# Patient Record
Sex: Male | Born: 1955 | Race: White | Hispanic: No | Marital: Married | Smoking: Current every day smoker
Health system: Southern US, Community
[De-identification: ages and names within clinical notes are randomized; demographics above are authoritative.]

## PROBLEM LIST (undated history)

## (undated) DIAGNOSIS — G35 Multiple sclerosis: Secondary | ICD-10-CM

## (undated) DIAGNOSIS — J449 Chronic obstructive pulmonary disease, unspecified: Secondary | ICD-10-CM

## (undated) DIAGNOSIS — I10 Essential (primary) hypertension: Secondary | ICD-10-CM

## (undated) DIAGNOSIS — H539 Unspecified visual disturbance: Secondary | ICD-10-CM

## (undated) DIAGNOSIS — G629 Polyneuropathy, unspecified: Secondary | ICD-10-CM

## (undated) DIAGNOSIS — C801 Malignant (primary) neoplasm, unspecified: Secondary | ICD-10-CM

## (undated) HISTORY — DX: Multiple sclerosis: G35

## (undated) HISTORY — PX: TONSILLECTOMY AND ADENOIDECTOMY: SHX28

## (undated) HISTORY — DX: Polyneuropathy, unspecified: G62.9

## (undated) HISTORY — PX: TONSILLECTOMY: SUR1361

## (undated) HISTORY — PX: WISDOM TOOTH EXTRACTION: SHX21

## (undated) HISTORY — DX: Unspecified visual disturbance: H53.9

---

## 2015-02-13 ENCOUNTER — Ambulatory Visit (INDEPENDENT_AMBULATORY_CARE_PROVIDER_SITE_OTHER): Payer: PRIVATE HEALTH INSURANCE | Admitting: Neurology

## 2015-02-13 ENCOUNTER — Encounter: Payer: Self-pay | Admitting: Neurology

## 2015-02-13 VITALS — BP 136/80 | HR 68 | Resp 12 | Ht 67.0 in | Wt 135.4 lb

## 2015-02-13 DIAGNOSIS — R350 Frequency of micturition: Secondary | ICD-10-CM | POA: Diagnosis not present

## 2015-02-13 DIAGNOSIS — M545 Low back pain, unspecified: Secondary | ICD-10-CM | POA: Insufficient documentation

## 2015-02-13 DIAGNOSIS — G35 Multiple sclerosis: Secondary | ICD-10-CM

## 2015-02-13 DIAGNOSIS — R5383 Other fatigue: Secondary | ICD-10-CM | POA: Insufficient documentation

## 2015-02-13 DIAGNOSIS — G2581 Restless legs syndrome: Secondary | ICD-10-CM | POA: Insufficient documentation

## 2015-02-13 DIAGNOSIS — R208 Other disturbances of skin sensation: Secondary | ICD-10-CM | POA: Diagnosis not present

## 2015-02-13 MED ORDER — GABAPENTIN 600 MG PO TABS: 600.0000 mg | ORAL_TABLET | Freq: Four times a day (QID) | ORAL | Status: DC

## 2015-02-13 MED ORDER — BACLOFEN 10 MG PO TABS: 10.0000 mg | ORAL_TABLET | Freq: Three times a day (TID) | ORAL | Status: DC

## 2015-02-13 MED ORDER — OXYBUTYNIN CHLORIDE 5 MG PO TABS: 5.0000 mg | ORAL_TABLET | Freq: Two times a day (BID) | ORAL | Status: DC

## 2015-02-13 MED ORDER — HYDROCODONE-ACETAMINOPHEN 5-325 MG PO TABS: 1.0000 | ORAL_TABLET | Freq: Three times a day (TID) | ORAL | Status: DC | PRN

## 2015-02-13 MED ORDER — ROPINIROLE HCL 1 MG PO TABS: 1.0000 mg | ORAL_TABLET | Freq: Two times a day (BID) | ORAL | Status: DC

## 2015-02-13 MED ORDER — GLATIRAMER ACETATE 20 MG/ML ~~LOC~~ SOSY: 20.0000 mg | PREFILLED_SYRINGE | Freq: Every day | SUBCUTANEOUS | Status: AC

## 2015-02-13 NOTE — Progress Notes (Signed)
[facility]  PATIENT: John Sullivan DOB: 1956/11/01  REFERRING DOCTOR OR PCP:  None SOURCE: patient and wife  _________________________________   HISTORICAL  CHIEF COMPLAINT:  Chief Complaint  Patient presents with  . Multiple Sclerosis    Sts. he tolerates Copaxone well.  Sts. he is having more throbbing, dull pain in right heel.  Sts. everything else is about the same/fim    HISTORY OF PRESENT ILLNESS:  John Sullivan is a 59 yo man who was diagnosed with MS in 2001 after presenting with numbness in the hands and legs and vertigo.   At first,m he saw a chiropractor and then was referred to neurology (Dr. Nicole Kindred) after symptoms persisted.   He had an MRI and LP, both consistent with MS.    He went on Rebif x 18 months but did not tolerate it well. He switched to see me around 2004 and was started on Copaxone.  He also took Low dose Naltrexone for a few days.   He continiue son Copaxone every other day and tolerates it well.   He has had exacerbation 5 years ago with diplopia, treated with IV steroid.    His last MRI was about 2 years ago and was fairly stable.      Gait/strength/sensation:  He denies problem with walking or with strength.   His right foot has numbness.  He feels the numbness, with a dull ache, is stable x many years.      Bladder:   He is doing better since starting oxybutynin.   He now has 1 x nocturia and only occasional problem with urgency.  Vision:  No current diplopia or other visual changes.     He wears glasses.  Fatigue/Sleep:  Many days, he has mild fatigue.    He sleeps well with night time marijuana every night.     He snores but has never had apneic symptoms.    He denies daytime sleepiness.  Mood/Cognition:   He denies any depression or anxiety.    He feels unmotivated but not depressed.   He denies any major cognitive problems.   Occasionally,  he has word finding difficulty.    RLS:   He reports an uncomfortable sensation in his  legs, better if he moves his legs.   Ropinirole and gabapentin has helped a lot.    He tolerates them well.   LBP:   He notes LBP that flares up now and then, worse with activity.   No radiation into legs.   He occ takes hydrocodone with benefit  His sister has MS  REVIEW OF SYSTEMS: Constitutional: No fevers, chills, sweats, or change in appetite.    Mild fatigue Eyes: No visual changes, double vision, eye pain Ear, nose and throat: No hearing loss, ear pain, nasal congestion, sore throat Cardiovascular: No chest pain, palpitations Respiratory: No shortness of breath at rest or with exertion.   No wheezes GastrointestinaI: No nausea, vomiting, diarrhea, abdominal pain, fecal incontinence Genitourinary: No dysuria, urinary retention or frequency.  No nocturia. Musculoskeletal: No neck pain, back pain Integumentary: No rash, pruritus, skin lesions Neurological: as above Psychiatric: No depression at this time.  No anxiety Endocrine: No palpitations, diaphoresis, change in appetite, change in weigh or increased thirst Hematologic/Lymphatic: No anemia, purpura, petechiae. Allergic/Immunologic: No itchy/runny eyes, nasal congestion, recent allergic reactions, rashes  ALLERGIES: No Known Allergies  HOME MEDICATIONS:  Current outpatient prescriptions:  .  amoxicillin (AMOXIL) 500 MG capsule, Take 500 mg by mouth 4 (  four) times daily., Disp: , Rfl:  .  baclofen (LIORESAL) 10 MG tablet, Take 10 mg by mouth 3 (three) times daily., Disp: , Rfl:  .  cholecalciferol (VITAMIN D) 1000 UNITS tablet, Take 1,000 Units by mouth daily., Disp: , Rfl:  .  gabapentin (NEURONTIN) 600 MG tablet, , Disp: , Rfl: 4 .  glatiramer (COPAXONE) 20 MG/ML SOSY injection, Inject 20 mg into the skin daily., Disp: , Rfl:  .  HYDROcodone-acetaminophen (NORCO/VICODIN) 5-325 MG per tablet, Take 1 tablet by mouth 3 (three) times daily as needed for moderate pain., Disp: , Rfl:  .  Multiple Vitamin (MULTIVITAMIN)  capsule, Take 1 capsule by mouth daily., Disp: , Rfl:  .  oxybutynin (DITROPAN) 5 MG tablet, Take 5 mg by mouth 2 (two) times daily., Disp: , Rfl: 3 .  rOPINIRole (REQUIP) 1 MG tablet, Take 1 mg by mouth 2 (two) times daily., Disp: , Rfl:   PAST MEDICAL HISTORY: Past Medical History  Diagnosis Date  . Multiple sclerosis   . Neuropathy   . Vision abnormalities     PAST SURGICAL HISTORY: Past Surgical History  Procedure Laterality Date  . Tonsillectomy and adenoidectomy    . Wisdom tooth extraction      FAMILY HISTORY: Family History  Problem Relation Age of Onset  . AAA (abdominal aortic aneurysm) Mother   . Diabetes Father     SOCIAL HISTORY:  History   Social History  . Marital Status: Significant Other    Spouse Name: N/A  . Number of Children: N/A  . Years of Education: N/A   Occupational History  . Not on file.   Social History Main Topics  . Smoking status: Current Every Day Smoker -- 0.50 packs/day    Types: Cigarettes  . Smokeless tobacco: Not on file  . Alcohol Use: 0.0 oz/week    0 Standard drinks or equivalent per week     Comment: rare  . Drug Use: No  . Sexual Activity: Not on file   Other Topics Concern  . Not on file   Social History Narrative  . No narrative on file     PHYSICAL EXAM  Filed Vitals:   02/13/15 1440  BP: 136/80  Pulse: 68  Resp: 12  Height: 5\' 7"  (1.702 m)  Weight: 135 lb 6.4 oz (61.417 kg)    Body mass index is 21.2 kg/(m^2).   General: The patient is well-developed and well-nourished and in no acute distress  Eyes:  Funduscopic exam shows normal optic discs and retinal vessels.  Neck: The neck is supple, no carotid bruits are noted.  The neck is nontender.  Cardiovascular: The heart has a regular rate and rhythm with a normal S1 and S2. There were no murmurs, gallops or rubs. Lungs are clear to auscultation.  Skin: Extremities are without significant edema.  Musculoskeletal:  Back is  nontender  Neurologic Exam  Mental status: The patient is alert and oriented x 3 at the time of the examination. The patient has apparent normal recent and remote memory, with an apparently normal attention span and concentration ability.   Speech is normal.  Cranial nerves: Extraocular movements are full. Pupils are equal, round, and reactive to light and accomodation.  Visual fields are full.  Facial symmetry is present. There is good facial sensation to soft touch bilaterally.Facial strength is normal.  Trapezius and sternocleidomastoid strength is normal. No dysarthria is noted.  The tongue is midline, and the patient has symmetric elevation of the soft  palate. No obvious hearing deficits are noted.  Motor:  Muscle bulk is normal.   Tone is normal. Strength is  5 / 5 in all 4 extremities.   Sensory: Sensory testing is intact to pinprick, soft touch and vibration sensation in all 4 extremities.  Coordination: Cerebellar testing reveals good finger-nose-finger and heel-to-shin bilaterally.  Gait and station: Station is normal.   Gait is normal. Tandem gait is wide. Romberg is negative.   Reflexes: Deep tendon reflexes are symmetric and normal bilaterally.   Plantar responses are flexor.    DIAGNOSTIC DATA (LABS, IMAGING, TESTING) - I reviewed patient records, labs, notes, testing and imaging myself where available.    ASSESSMENT AND PLAN  Multiple sclerosis  Bilateral low back pain without sciatica  Urinary frequency  Other fatigue  Dysesthesia  Restless leg   In summary, Mr. Ganaway is a 59 year old man with relapsing remitting multiple sclerosis. He has not had any exacerbation over the past 4 or 5 years and has had only one serious exacerbation while on Copaxone. MRIs have been mostly stable. He will continue on Copaxone, though we did briefly discussed the oral options. His back pain continues to bother him at times when more severe he will take a hydrocodone and do  better. I refilled this for him. Gabapentin and ropinirole helping the dysesthesias and restless leg syndrome and that has allowed him to sleep better. This has also helped his fatigue. Bladder function does well on oxybutynin. I will refill his medications for him.  He will return to see [place] in 6 months or call sooner if he has new or worsening neurologic symptoms.   Richard A. Felecia Shelling, MD, PhD 5/85/9292, 4:46 PM Certified in Neurology, Clinical Neurophysiology, Sleep Medicine, Pain Medicine and Neuroimaging  [facility] [address] [place], [place] [address] [phone]

## 2015-08-19 ENCOUNTER — Encounter: Payer: Self-pay | Admitting: Neurology

## 2015-08-19 ENCOUNTER — Ambulatory Visit (INDEPENDENT_AMBULATORY_CARE_PROVIDER_SITE_OTHER): Payer: PRIVATE HEALTH INSURANCE | Admitting: Neurology

## 2015-08-19 VITALS — BP 130/76 | HR 92 | Resp 14 | Ht 67.0 in | Wt 135.0 lb

## 2015-08-19 DIAGNOSIS — M25512 Pain in left shoulder: Secondary | ICD-10-CM | POA: Insufficient documentation

## 2015-08-19 DIAGNOSIS — G2581 Restless legs syndrome: Secondary | ICD-10-CM

## 2015-08-19 DIAGNOSIS — G35 Multiple sclerosis: Secondary | ICD-10-CM | POA: Diagnosis not present

## 2015-08-19 DIAGNOSIS — R350 Frequency of micturition: Secondary | ICD-10-CM | POA: Diagnosis not present

## 2015-08-19 DIAGNOSIS — R5383 Other fatigue: Secondary | ICD-10-CM

## 2015-08-19 DIAGNOSIS — R208 Other disturbances of skin sensation: Secondary | ICD-10-CM | POA: Diagnosis not present

## 2015-08-19 MED ORDER — HYDROCODONE-ACETAMINOPHEN 5-325 MG PO TABS: 1.0000 | ORAL_TABLET | Freq: Three times a day (TID) | ORAL | Status: DC | PRN

## 2015-08-19 MED ORDER — GABAPENTIN 800 MG PO TABS: 800.0000 mg | ORAL_TABLET | Freq: Three times a day (TID) | ORAL | Status: DC

## 2015-08-19 NOTE — Progress Notes (Signed)
[facility]  PATIENT: John Sullivan DOB: 03-15-1956  REFERRING DOCTOR OR PCP:  None SOURCE: patient and wife  _________________________________   HISTORICAL  CHIEF COMPLAINT:  Chief Complaint  Patient presents with  . Multiple Sclerosis    Sts. he continues to tolerate Copaxone well.  Sts. lbp radiating down both legs (right worse than left) has been worse over the last 8 mos.  Denies new injury.  Sts. RLS sx. are "pretty good"/fim    HISTORY OF PRESENT ILLNESS:  John Sullivan is a 58 yo man with MS.    He is on Copaxone every other day and tolerates it well.   He has had exacerbation 5 years ago with diplopia, treated with IV steroid.    His last MRI was about 2 years ago and was fairly stable.      Gait/strength/sensation:  He denies problem with walking or with strength.   His right foot has numbness.  He feels the numbness, with a dull ache, is stable x many years.      Bladder:   He is doing better since starting oxybutynin.   He now has 1 x nocturia and only occasional problem with urgency.  Vision:  No current diplopia or other visual changes.     He wears glasses.  Fatigue/Sleep:  Many days, he has mild fatigue.    He sleeps well with night time marijuana every night.     He snores but has never had apneic symptoms.    He denies daytime sleepiness.  Mood/Cognition:   He denies any depression or anxiety.    He feels unmotivated but not depressed.   He denies any major cognitive problems.   Occasionally,  he has word finding difficulty.    Irritability improved on [place] John's Wort  RLS:   He has an uncomfortable sensation in his legs, that is better if he moves his legs.   Ropinirole and gabapentin and MJ has helped a lot.    He tolerates them well.   LBP:   He notes LBP that flares up now and then, worse with activity.   No radiation into legs.   He occ takes hydrocodone with benefit  (seldom more than a few times a week)  Shoulder pain:   He reports  left shoulder and neck pain.   Pain is worse when he externally rotates or scratches back.     MS History:   He was diagnosed with MS in 2001 after presenting with numbness in the hands and legs and vertigo.  He saw Dr. Nicole Kindred after symptoms persisted.   He had an MRI and LP, both consistent with MS.    He went on Rebif x 18 months but did not tolerate it well. He switched to see me around 2004.   MRI's have been stable on Copaxone.  He also took low dose Naltrexone for a few days.    His sister has MS, also  REVIEW OF SYSTEMS: Constitutional: No fevers, chills, sweats, or change in appetite.    Mild fatigue Eyes: No visual changes, double vision, eye pain Ear, nose and throat: No hearing loss, ear pain, nasal congestion, sore throat Cardiovascular: No chest pain, palpitations Respiratory: No shortness of breath at rest or with exertion.   No wheezes GastrointestinaI: No nausea, vomiting, diarrhea, abdominal pain, fecal incontinence Genitourinary: No dysuria, urinary retention or frequency.  No nocturia. Musculoskeletal: No neck pain, back pain Integumentary: No rash, pruritus, skin lesions Neurological: as above Psychiatric:  No depression at this time.  No anxiety Endocrine: No palpitations, diaphoresis, change in appetite, change in weigh or increased thirst Hematologic/Lymphatic: No anemia, purpura, petechiae. Allergic/Immunologic: No itchy/runny eyes, nasal congestion, recent allergic reactions, rashes  ALLERGIES: No Known Allergies  HOME MEDICATIONS:  Current outpatient prescriptions:  .  baclofen (LIORESAL) 10 MG tablet, Take 1 tablet (10 mg total) by mouth 3 (three) times daily., Disp: 90 each, Rfl: 11 .  cholecalciferol (VITAMIN D) 1000 UNITS tablet, Take 1,000 Units by mouth daily., Disp: , Rfl:  .  gabapentin (NEURONTIN) 600 MG tablet, Take 1 tablet (600 mg total) by mouth 4 (four) times daily., Disp: 100 tablet, Rfl: 11 .  glatiramer (COPAXONE) 20 MG/ML SOSY injection,  Inject 1 mL (20 mg total) into the skin daily., Disp: 30 each, Rfl: 11 .  HYDROcodone-acetaminophen (NORCO/VICODIN) 5-325 MG per tablet, Take 1 tablet by mouth 3 (three) times daily as needed for moderate pain., Disp: 90 tablet, Rfl: 0 .  Multiple Vitamin (MULTIVITAMIN) capsule, Take 1 capsule by mouth daily., Disp: , Rfl:  .  oxybutynin (DITROPAN) 5 MG tablet, Take 1 tablet (5 mg total) by mouth 2 (two) times daily., Disp: 60 tablet, Rfl: 11 .  rOPINIRole (REQUIP) 1 MG tablet, Take 1 tablet (1 mg total) by mouth 2 (two) times daily., Disp: 60 tablet, Rfl: 11 .  amoxicillin (AMOXIL) 500 MG capsule, Take 500 mg by mouth 4 (four) times daily., Disp: , Rfl:   PAST MEDICAL HISTORY: Past Medical History  Diagnosis Date  . Multiple sclerosis   . Neuropathy   . Vision abnormalities     PAST SURGICAL HISTORY: Past Surgical History  Procedure Laterality Date  . Tonsillectomy and adenoidectomy    . Wisdom tooth extraction      FAMILY HISTORY: Family History  Problem Relation Age of Onset  . AAA (abdominal aortic aneurysm) Mother   . Diabetes Father     SOCIAL HISTORY:  Social History   Social History  . Marital Status: Significant Other    Spouse Name: N/A  . Number of Children: N/A  . Years of Education: N/A   Occupational History  . Not on file.   Social History Main Topics  . Smoking status: Current Every Day Smoker -- 0.50 packs/day    Types: Cigarettes  . Smokeless tobacco: Not on file  . Alcohol Use: 0.0 oz/week    0 Standard drinks or equivalent per week     Comment: rare  . Drug Use: No  . Sexual Activity: Not on file   Other Topics Concern  . Not on file   Social History Narrative     PHYSICAL EXAM  Filed Vitals:   08/19/15 1409  BP: 130/76  Pulse: 92  Resp: 14  Height: 5\' 7"  (1.702 m)  Weight: 135 lb (61.236 kg)    Body mass index is 21.14 kg/(m^2).   General: The patient is well-developed and well-nourished and in no acute distress  Neck:  The neck is supple, no carotid bruits are noted.  The neck is nontender.  Musculoskeletal:  Back is mildly tender. The left shoulder is tender at the subacromial bursa. Range of motion is slightly reduced in the left shoulder.  Neurologic Exam  Mental status: The patient is alert and oriented x 3 at the time of the examination. The patient has apparent normal recent and remote memory, with an apparently normal attention span and concentration ability.   Speech is normal.  Cranial nerves: Extraocular movements are full.  Pupils are equal, round, and reactive to light and accomodation.  Visual fields are full.  Facial symmetry is present. There is good facial sensation to soft touch bilaterally.Facial strength is normal.  Trapezius and sternocleidomastoid strength is normal. No dysarthria is noted.  The tongue is midline, and the patient has symmetric elevation of the soft palate. No obvious hearing deficits are noted.  Motor:  Muscle bulk is normal.   Tone is normal. Strength is  5 / 5 in all 4 extremities.   Sensory: Sensory testing is intact to  soft touch and vibration sensation in all 4 extremities.  Coordination: Cerebellar testing reveals good finger-nose-finger and heel-to-shin bilaterally.  Gait and station: Station is normal.   Gait is normal. Tandem gait is wide. Romberg is negative.   Reflexes: Deep tendon reflexes are symmetric and normal bilaterally.    DIAGNOSTIC DATA (LABS, IMAGING, TESTING) - I reviewed patient records, labs, notes, testing and imaging myself where available.    ASSESSMENT AND PLAN  Left shoulder pain  Multiple sclerosis  Urinary frequency  Restless leg  Dysesthesia  Other fatigue  1.  Continue Copaxone. 2.   Change gabapentin to 800 mg by mouth 3 times a day and continue when necessary ropinirole for days when restless leg syndrome is worse. 3.   I gave her another prescription for hydrocodone. He uses it sparingly. 4.   He will continue to  be active and exercises as tolerated. 5.   He will return to see [place] in 6\5 months or call sooner if he has new or worsening neurologic symptoms.   David Towson A. Felecia Shelling, MD, PhD 1/76/1607, 3:71 PM Certified in Neurology, Clinical Neurophysiology, Sleep Medicine, Pain Medicine and Neuroimaging  [facility] [address], [place], [place] [address] [phone]

## 2016-01-20 ENCOUNTER — Ambulatory Visit: Payer: PRIVATE HEALTH INSURANCE | Admitting: Neurology

## 2016-03-04 ENCOUNTER — Ambulatory Visit (INDEPENDENT_AMBULATORY_CARE_PROVIDER_SITE_OTHER): Payer: BLUE CROSS/BLUE SHIELD | Admitting: Neurology

## 2016-03-04 ENCOUNTER — Encounter: Payer: Self-pay | Admitting: Neurology

## 2016-03-04 ENCOUNTER — Other Ambulatory Visit: Payer: Self-pay | Admitting: Neurology

## 2016-03-04 VITALS — BP 122/74 | HR 82 | Resp 16 | Ht 67.0 in | Wt 136.5 lb

## 2016-03-04 DIAGNOSIS — G35 Multiple sclerosis: Secondary | ICD-10-CM

## 2016-03-04 DIAGNOSIS — G2581 Restless legs syndrome: Secondary | ICD-10-CM | POA: Diagnosis not present

## 2016-03-04 DIAGNOSIS — R5383 Other fatigue: Secondary | ICD-10-CM

## 2016-03-04 DIAGNOSIS — R350 Frequency of micturition: Secondary | ICD-10-CM

## 2016-03-04 DIAGNOSIS — R208 Other disturbances of skin sensation: Secondary | ICD-10-CM | POA: Diagnosis not present

## 2016-03-04 MED ORDER — LAMOTRIGINE 25 MG PO TABS
ORAL_TABLET | ORAL | Status: DC
Start: 2016-03-04 — End: 2016-08-05

## 2016-03-04 MED ORDER — LAMOTRIGINE 100 MG PO TABS
100.0000 mg | ORAL_TABLET | Freq: Two times a day (BID) | ORAL | Status: DC
Start: 2016-03-04 — End: 2016-08-05

## 2016-03-04 NOTE — Patient Instructions (Signed)
The pharmacy has the prescription of lamotrigine 25 mg. Please take it as follows: For 1 week take 1 pill once a day. The second week, take 1 pill twice a day. The third week, take 1 pill in the morning and 2 at bedtime or evening. The fourth week take 2 pills twice a day.  I have also give you a paper prescription for lamotrigine 100 mg tablets after the first 4 weeks you should take 100 mg twice a day.  Most people tolerate lamotrigine very well. Some will get a rash. If you do get a significant rash stop the medicine immediately and let us know. 

## 2016-03-04 NOTE — Progress Notes (Signed)
[facility]  PATIENT: John Sullivan DOB: 12/23/1955  REFERRING DOCTOR OR PCP:  None SOURCE: patient and wife  _________________________________   HISTORICAL  CHIEF COMPLAINT:  Chief Complaint  Patient presents with  . Multiple Sclerosis    Sts. he continues to tolerate Copaxone well.  Denes new or worsening sx./fim    HISTORY OF PRESENT ILLNESS:  John Sullivan is a 60 yo man with MS.    He feels stable for the most part, though has had some cognitive concerns  MS:   He is on Copaxone every other day and tolerates it well.   He has had exacerbation 5 years ago with diplopia, treated with IV steroid.  He notes he is making more mistakes at work.   Gait/strength/sensation:  He denies problem with walking or with strength.   His right leg has numbness and dysesthetic pain.  Pain is a dull ache with occasional shooting electric sensation.         Bladder:   He is doing better since starting oxybutynin.   He only rarely has urgency now.  Vision:  No current diplopia or other visual changes.     He wears glasses.  Fatigue/Sleep/RLS:  Many days, he has mild fatigue.    He sleeps well with night time marijuana every night.     He snores but has never had apneic symptoms.    He has no daytime sleepiness.  No naps.   He has an uncomfortable RLS sensation in his legs, that is better if he moves his legs.   Ropinirole and gabapentin and MJ has helped a lot.    He tolerates them well.   Mood/Cognition:   He denies any depression or anxiety.   He occasionally has irritability --- if so, he takes [place] with benefit.    He has mild cognitive problems.   Occasionally, he has word finding difficulty.  He makes occasional errors at work.     Pain:   He reports occasional lower back, neck or shoulder pain but is doing better than he was feeling at his last visit.  He only uses hydrocodone sparingly and does not ned a refill.   MS History:   He was diagnosed with MS in  2001 after presenting with numbness in the hands and legs and vertigo.  He saw Dr. Nicole Kindred after symptoms persisted.   He had an MRI and LP, both consistent with MS.    He went on Rebif x 18 months but did not tolerate it well. He switched to see me around 2004.   MRI's have been stable on Copaxone.  He also took low dose Naltrexone for a few days.    His sister has MS, also  REVIEW OF SYSTEMS: Constitutional: No fevers, chills, sweats, or change in appetite.    Mild fatigue Eyes: No visual changes, double vision, eye pain Ear, nose and throat: No hearing loss, ear pain, nasal congestion, sore throat Cardiovascular: No chest pain, palpitations Respiratory: No shortness of breath at rest or with exertion.   No wheezes GastrointestinaI: No nausea, vomiting, diarrhea, abdominal pain, fecal incontinence Genitourinary: No dysuria, urinary retention or frequency.  No nocturia. Musculoskeletal: No current neck pain, mils back pain Integumentary: No rash, pruritus, skin lesions Neurological: as above Psychiatric: No depression at this time.  No anxiety Endocrine: No palpitations, diaphoresis, change in appetite, change in weigh or increased thirst Hematologic/Lymphatic: No anemia, purpura, petechiae. Allergic/Immunologic: No itchy/runny eyes, nasal congestion, recent allergic  reactions, rashes  ALLERGIES: No Known Allergies  HOME MEDICATIONS:  Current outpatient prescriptions:  .  baclofen (LIORESAL) 10 MG tablet, Take 1 tablet (10 mg total) by mouth 3 (three) times daily., Disp: 90 each, Rfl: 11 .  cholecalciferol (VITAMIN D) 1000 UNITS tablet, Take 1,000 Units by mouth daily., Disp: , Rfl:  .  gabapentin (NEURONTIN) 800 MG tablet, Take 1 tablet (800 mg total) by mouth 3 (three) times daily., Disp: 90 tablet, Rfl: 11 .  glatiramer (COPAXONE) 20 MG/ML SOSY injection, Inject 1 mL (20 mg total) into the skin daily., Disp: 30 each, Rfl: 11 .  HYDROcodone-acetaminophen (NORCO/VICODIN) 5-325 MG  per tablet, Take 1 tablet by mouth 3 (three) times daily as needed for moderate pain., Disp: 90 tablet, Rfl: 0 .  Multiple Vitamin (MULTIVITAMIN) capsule, Take 1 capsule by mouth daily., Disp: , Rfl:  .  oxybutynin (DITROPAN) 5 MG tablet, Take 1 tablet (5 mg total) by mouth 2 (two) times daily., Disp: 60 tablet, Rfl: 11 .  rOPINIRole (REQUIP) 1 MG tablet, Take 1 tablet (1 mg total) by mouth 2 (two) times daily., Disp: 60 tablet, Rfl: 11 .  lamoTRIgine (LAMICTAL) 100 MG tablet, Take 1 tablet (100 mg total) by mouth 2 (two) times daily., Disp: 60 tablet, Rfl: 11 .  lamoTRIgine (LAMICTAL) 25 MG tablet, 1 po qd x 1 wk, then 1 po bid x 1 wk, then 1 po tid x 1 wk, then 2 po bid x 1 wk, Disp: 70 tablet, Rfl: 3  PAST MEDICAL HISTORY: Past Medical History  Diagnosis Date  . Multiple sclerosis ([place])   . Neuropathy ([place])   . Vision abnormalities     PAST SURGICAL HISTORY: Past Surgical History  Procedure Laterality Date  . Tonsillectomy and adenoidectomy    . Wisdom tooth extraction      FAMILY HISTORY: Family History  Problem Relation Age of Onset  . AAA (abdominal aortic aneurysm) Mother   . Diabetes Father     SOCIAL HISTORY:  Social History   Social History  . Marital Status: Significant Other    Spouse Name: N/A  . Number of Children: N/A  . Years of Education: N/A   Occupational History  . Not on file.   Social History Main Topics  . Smoking status: Current Every Day Smoker -- 0.50 packs/day    Types: Cigarettes  . Smokeless tobacco: Not on file  . Alcohol Use: 0.0 oz/week    0 Standard drinks or equivalent per week     Comment: rare  . Drug Use: No  . Sexual Activity: Not on file   Other Topics Concern  . Not on file   Social History Narrative     PHYSICAL EXAM  Filed Vitals:   03/04/16 1443  BP: 122/74  Pulse: 82  Resp: 16  Height: 5\' 7"  (1.702 m)  Weight: 136 lb 8 oz (61.916 kg)    Body mass index is 21.37 kg/(m^2).   General: The patient is  well-developed and well-nourished and in no acute distress  Neck: The neck is supple, no carotid bruits are noted.  The neck is nontender.   Neurologic Exam  Mental status: The patient is alert and oriented x 3 at the time of the examination. The patient has apparent normal recent and remote memory, with an apparently normal attention span and concentration ability.   Speech is normal.  Cranial nerves: Extraocular movements are full. .Facial strength is normal.  Trapezius and sternocleidomastoid strength is normal. No  dysarthria is noted.  The tongue is midline, and the patient has symmetric elevation of the soft palate. No obvious hearing deficits are noted but hearing slightly better on left.    Motor:  Muscle bulk is normal.   Tone is slightly increased in legs. Strength is  5 / 5 in all 4 extremities.   Sensory: Sensory testing is intact to soft touch and vibration sensation in all 4 extremities.  Coordination: Cerebellar testing reveals good finger-nose-finger and heel-to-shin bilaterally.  Gait and station: Station is normal.   Gait is normal. Tandem gait is wide. Romberg is negative.   Reflexes: Deep tendon reflexes are symmetric and normal bilaterally.     DIAGNOSTIC DATA (LABS, IMAGING, TESTING) - I reviewed patient records, labs, notes, testing and imaging myself where available.    ASSESSMENT AND PLAN  Multiple sclerosis (HCC)  Dysesthesia  Other fatigue  Urinary frequency  Restless leg  1.  Continue Copaxone. 2.   Continue gabapentin to 800 mg tid and add lamotrigine (titrate to 100 mg po bid) 3.  He will continue to be active and exercises as tolerated. 5.   He will return to see [place] in 5 months or call sooner if he has new or worsening neurologic symptoms.   Marelly Wehrman A. Felecia Shelling, MD, PhD [id number], [id number] PM Certified in Neurology, Clinical Neurophysiology, Sleep Medicine, Pain Medicine and Neuroimaging  [facility] [address] [place], [place] [address] [phone]

## 2016-03-08 ENCOUNTER — Other Ambulatory Visit: Payer: Self-pay | Admitting: Neurology

## 2016-03-13 ENCOUNTER — Other Ambulatory Visit: Payer: Self-pay | Admitting: Neurology

## 2016-03-18 ENCOUNTER — Other Ambulatory Visit: Payer: Self-pay | Admitting: *Deleted

## 2016-03-18 ENCOUNTER — Telehealth: Payer: Self-pay | Admitting: Neurology

## 2016-03-18 MED ORDER — OXYBUTYNIN CHLORIDE 5 MG PO TABS: 5.0000 mg | ORAL_TABLET | Freq: Two times a day (BID) | ORAL | Status: DC

## 2016-03-18 MED ORDER — GABAPENTIN 800 MG PO TABS: 800.0000 mg | ORAL_TABLET | Freq: Three times a day (TID) | ORAL | Status: DC

## 2016-03-18 NOTE — Telephone Encounter (Signed)
Patient's partner is calling to get Rx's for gabapentin (NEURONTIN) 800 MG tablet(the dosage was changed on 03-04-16) and oxybutynin (DITROPAN) 5 MG tablet called to [facility] [address] in [place]. Thank you.

## 2016-03-18 NOTE — Telephone Encounter (Signed)
I have spoken with John Sullivan and let her know rx's have been escribed to [facility]

## 2016-05-14 ENCOUNTER — Other Ambulatory Visit: Payer: Self-pay | Admitting: Neurology

## 2016-05-14 NOTE — Telephone Encounter (Signed)
Wife called to advise that "because of time lapse with her pain medication, she took husband's prescription of HYDROcodone-acetaminophen (NORCO/VICODIN) 5-325 MG per tablet, she used the entire bottle", requests refill of this prescription for husband.

## 2016-05-14 NOTE — Telephone Encounter (Signed)
Please give Arthor Captain a call and make sure she is aware that she should not be her husband's medication. Should take her prescribed dose.

## 2016-05-17 NOTE — Telephone Encounter (Signed)
LMVM for John Sullivan to return call.

## 2016-05-17 NOTE — Telephone Encounter (Signed)
I spoke to wife of pt.  She stated that her endocet had been increased to 5 times daily and she was not able to get refill yet.  So she took her husbands.  (which dulled but did not get rid of pain).  She will pick up prescription today for herself.  I told her that she is not to take her husbands medications.  She is aware.  Her husband does not take very often but she felt like he would need prescription.  I relayed that Dr. Felecia Shelling out of office until next week.  She felt like he could wait as it is prn.

## 2016-05-17 NOTE — Telephone Encounter (Signed)
Dub Mikes returned Sandy's call. Please call [phone].

## 2016-06-04 MED ORDER — HYDROCODONE-ACETAMINOPHEN 5-325 MG PO TABS: 1.0000 | ORAL_TABLET | Freq: Three times a day (TID) | ORAL | Status: DC | PRN

## 2016-08-05 ENCOUNTER — Ambulatory Visit (INDEPENDENT_AMBULATORY_CARE_PROVIDER_SITE_OTHER): Payer: BLUE CROSS/BLUE SHIELD | Admitting: Neurology

## 2016-08-05 ENCOUNTER — Encounter: Payer: Self-pay | Admitting: Neurology

## 2016-08-05 VITALS — BP 126/88 | HR 84 | Resp 16 | Ht 67.0 in | Wt 142.0 lb

## 2016-08-05 DIAGNOSIS — G35 Multiple sclerosis: Secondary | ICD-10-CM

## 2016-08-05 DIAGNOSIS — R5383 Other fatigue: Secondary | ICD-10-CM

## 2016-08-05 DIAGNOSIS — R350 Frequency of micturition: Secondary | ICD-10-CM

## 2016-08-05 DIAGNOSIS — R208 Other disturbances of skin sensation: Secondary | ICD-10-CM

## 2016-08-05 DIAGNOSIS — G2581 Restless legs syndrome: Secondary | ICD-10-CM | POA: Diagnosis not present

## 2016-08-05 DIAGNOSIS — M545 Low back pain, unspecified: Secondary | ICD-10-CM

## 2016-08-05 MED ORDER — BACLOFEN 10 MG PO TABS: 10.0000 mg | ORAL_TABLET | Freq: Three times a day (TID) | ORAL | 11 refills | Status: DC

## 2016-08-05 MED ORDER — LAMOTRIGINE 100 MG PO TABS: 100.0000 mg | ORAL_TABLET | Freq: Two times a day (BID) | ORAL | 11 refills | Status: DC

## 2016-08-05 MED ORDER — OXYBUTYNIN CHLORIDE 5 MG PO TABS: 5.0000 mg | ORAL_TABLET | Freq: Two times a day (BID) | ORAL | 11 refills | Status: DC

## 2016-08-05 MED ORDER — HYDROCODONE-ACETAMINOPHEN 5-325 MG PO TABS: 1.0000 | ORAL_TABLET | Freq: Three times a day (TID) | ORAL | 0 refills | Status: DC | PRN

## 2016-08-05 MED ORDER — GABAPENTIN 800 MG PO TABS: 800.0000 mg | ORAL_TABLET | Freq: Three times a day (TID) | ORAL | 11 refills | Status: DC

## 2016-08-05 NOTE — Progress Notes (Signed)
[facility]  PATIENT: John Sullivan DOB: [birth year]  REFERRING DOCTOR OR PCP:  None SOURCE: patient and wife  _________________________________   HISTORICAL  CHIEF COMPLAINT:  Chief Complaint  Patient presents with  . Multiple Sclerosis    Sts. he continues to tolerate Copaxone well.  Sts. he is haivng more lbp radiating into right leg/fim    HISTORY OF PRESENT ILLNESS:  John Sullivan is a 60 yo man with MS.    He feels stable for the most part.   He is noting more trouble with the leg pain.  MS:   He feels stable on Copaxone every other day and tolerates it well.   His last exacerbation was 5 years ago with diplopia that improved after IV steroid.    Gait/strength/sensation/ leg pain:  He denies problem with walking or with strength.   His right leg has numbness and dysesthetic pain, helped by lamotrigine.  When present, pain is a dull ache with occasional shooting electric sensation.      He also has a lot of foot pain.   He has no pain in the morning but it builds up as the day goes on and is still painful at night.  He gets some benefit from the gabapentin .   He reports lower back pain also.    He only uses hydrocodone a few times a week and is out.Marland Kitchen   [place] databse reviewed, no filled scripts last 6 months.     Bladder:   He is doing better since starting oxybutynin.   He only rarely has urgency now.  Vision:  No diplopia or other visual changes.     He wears glasses.  Fatigue/Sleep/RLS:  He has mild fatigue, especially towards the end of the afternoon.    He sleeps well with night time marijuana every night.     He snores but has never had apneic symptoms.    He has no daytime sleepiness.  No naps.   He has an uncomfortable RLS sensation in his legs, that is better if he moves his legs.   Ropinirole and gabapentin and MJ has helped a lot.    He tolerates them well.   Mood/Cognition:   He denies any depression or anxiety.   He has less irritability on  lamotrigine.   .    He has very mild cognitive problems.   Occasionally, he has word finding difficulty.  He makes occasional errors at work.     MS History:   He was diagnosed with MS in 2001 after presenting with numbness in the hands and legs and vertigo.  He saw Dr. Nicole Kindred after symptoms persisted.   He had an MRI and LP, both consistent with MS.    He went on Rebif x 18 months but did not tolerate it well. He switched to see me around 2004.   MRI's have been stable on Copaxone.  He also took low dose Naltrexone for a few days.    His sister has MS, also  REVIEW OF SYSTEMS: Constitutional: No fevers, chills, sweats, or change in appetite.    Mild fatigue Eyes: No visual changes, double vision, eye pain Ear, nose and throat: No hearing loss, ear pain, nasal congestion, sore throat Cardiovascular: No chest pain, palpitations Respiratory: No shortness of breath at rest or with exertion.   No wheezes GastrointestinaI: No nausea, vomiting, diarrhea, abdominal pain, fecal incontinence Genitourinary: No dysuria, urinary retention or frequency.  No nocturia. Musculoskeletal: No current neck  pain, mils back pain Integumentary: No rash, pruritus, skin lesions Neurological: as above Psychiatric: No depression at this time.  No anxiety Endocrine: No palpitations, diaphoresis, change in appetite, change in weigh or increased thirst Hematologic/Lymphatic: No anemia, purpura, petechiae. Allergic/Immunologic: No itchy/runny eyes, nasal congestion, recent allergic reactions, rashes  ALLERGIES: No Known Allergies  HOME MEDICATIONS:  Current Outpatient Prescriptions:  .  baclofen (LIORESAL) 10 MG tablet, Take 1 tablet (10 mg total) by mouth 3 (three) times daily., Disp: 90 tablet, Rfl: 11 .  cholecalciferol (VITAMIN D) 1000 UNITS tablet, Take 1,000 Units by mouth daily., Disp: , Rfl:  .  gabapentin (NEURONTIN) 800 MG tablet, Take 1 tablet (800 mg total) by mouth 3 (three) times daily., Disp: 90  tablet, Rfl: 11 .  glatiramer (COPAXONE) 20 MG/ML SOSY injection, Inject 1 mL (20 mg total) into the skin daily., Disp: 30 each, Rfl: 11 .  HYDROcodone-acetaminophen (NORCO/VICODIN) 5-325 MG tablet, Take 1 tablet by mouth 3 (three) times daily as needed for moderate pain., Disp: 90 tablet, Rfl: 0 .  lamoTRIgine (LAMICTAL) 100 MG tablet, Take 1 tablet (100 mg total) by mouth 2 (two) times daily., Disp: 60 tablet, Rfl: 11 .  Multiple Vitamin (MULTIVITAMIN) capsule, Take 1 capsule by mouth daily., Disp: , Rfl:  .  oxybutynin (DITROPAN) 5 MG tablet, Take 1 tablet (5 mg total) by mouth 2 (two) times daily., Disp: 60 tablet, Rfl: 11 .  rOPINIRole (REQUIP) 1 MG tablet, Take 1 tablet (1 mg total) by mouth 2 (two) times daily., Disp: 60 tablet, Rfl: 11 .  TURMERIC PO, Take by mouth., Disp: , Rfl:   PAST MEDICAL HISTORY: Past Medical History:  Diagnosis Date  . Multiple sclerosis ([place])   . Neuropathy ([place])   . Vision abnormalities     PAST SURGICAL HISTORY: Past Surgical History:  Procedure Laterality Date  . TONSILLECTOMY AND ADENOIDECTOMY    . WISDOM TOOTH EXTRACTION      FAMILY HISTORY: Family History  Problem Relation Age of Onset  . AAA (abdominal aortic aneurysm) Mother   . Diabetes Father     SOCIAL HISTORY:  Social History   Social History  . Marital status: Significant Other    Spouse name: N/A  . Number of children: N/A  . Years of education: N/A   Occupational History  . Not on file.   Social History Main Topics  . Smoking status: Current Every Day Smoker    Packs/day: 0.50    Types: Cigarettes  . Smokeless tobacco: Not on file  . Alcohol use 0.0 oz/week     Comment: rare  . Drug use: No  . Sexual activity: Not on file   Other Topics Concern  . Not on file   Social History Narrative  . No narrative on file     PHYSICAL EXAM  Vitals:   08/05/16 1527  BP: 126/88  Pulse: 84  Resp: 16  Weight: 142 lb (64.4 kg)  Height: 5\' 7"  (1.702 m)    Body mass  index is 22.24 kg/m.   General: The patient is well-developed and well-nourished and in no acute distress  Neck: The neck is supple, no carotid bruits are noted.  The neck is nontender.   Neurologic Exam  Mental status: The patient is alert and oriented x 3 at the time of the examination. The patient has apparent normal recent and remote memory, with an apparently normal attention span and concentration ability.   Speech is normal.  Cranial nerves: Extraocular movements  are full. .Facial strength is normal.  Trapezius and sternocleidomastoid strength is normal. No dysarthria is noted.  The tongue is midline, and the patient has symmetric elevation of the soft palate. No obvious hearing deficits are noted but hearing slightly better on left.    Motor:  Muscle bulk is normal.   Tone is slightly increased in legs. Strength is  5 / 5 in all 4 extremities.   Sensory: Sensory testing is intact to soft touch and vibration sensation in all 4 extremities.  Coordination: Cerebellar testing reveals good finger-nose-finger and heel-to-shin bilaterally.  Gait and station: Station is normal.   Gait is normal. Tandem gait is wide. Romberg is negative.   Reflexes: Deep tendon reflexes are symmetric and normal bilaterally.     DIAGNOSTIC DATA (LABS, IMAGING, TESTING) - I reviewed patient records, labs, notes, testing and imaging myself where available.    ASSESSMENT AND PLAN  Multiple sclerosis (HCC)  Bilateral low back pain without sciatica  Restless leg  Dysesthesia  Other fatigue  Urinary frequency    1.  Continue Copaxone. 2.   Continue gabapentin to 800 mg tid and lamotrigine 100 mg po bid.    Hydrocodone when necessary 3.  He will continue to be active and exercises as tolerated. 5.   He will return to see [place] in 4 months or call sooner if he has new or worsening neurologic symptoms.   Lexis Potenza A. Felecia Shelling, MD, PhD [id number], [id number] PM Certified in Neurology, Clinical  Neurophysiology, Sleep Medicine, Pain Medicine and Neuroimaging  [facility] [address] [place], [place] [address] [phone]

## 2016-12-14 ENCOUNTER — Ambulatory Visit: Payer: BLUE CROSS/BLUE SHIELD | Admitting: Neurology

## 2017-02-08 ENCOUNTER — Encounter: Payer: Self-pay | Admitting: Neurology

## 2017-02-08 ENCOUNTER — Ambulatory Visit (INDEPENDENT_AMBULATORY_CARE_PROVIDER_SITE_OTHER): Payer: BLUE CROSS/BLUE SHIELD | Admitting: Neurology

## 2017-02-08 VITALS — BP 138/92 | HR 102 | Resp 14 | Ht 67.0 in | Wt 154.0 lb

## 2017-02-08 DIAGNOSIS — M545 Low back pain, unspecified: Secondary | ICD-10-CM

## 2017-02-08 DIAGNOSIS — R350 Frequency of micturition: Secondary | ICD-10-CM | POA: Diagnosis not present

## 2017-02-08 DIAGNOSIS — R5383 Other fatigue: Secondary | ICD-10-CM | POA: Diagnosis not present

## 2017-02-08 DIAGNOSIS — G35 Multiple sclerosis: Secondary | ICD-10-CM | POA: Diagnosis not present

## 2017-02-08 DIAGNOSIS — G2581 Restless legs syndrome: Secondary | ICD-10-CM

## 2017-02-08 DIAGNOSIS — R208 Other disturbances of skin sensation: Secondary | ICD-10-CM

## 2017-02-08 MED ORDER — HYDROCODONE-ACETAMINOPHEN 5-325 MG PO TABS: 1.0000 | ORAL_TABLET | Freq: Three times a day (TID) | ORAL | 0 refills | Status: DC | PRN

## 2017-02-08 NOTE — Progress Notes (Signed)
[facility]  PATIENT: John Sullivan DOB: 1956/02/25  REFERRING DOCTOR OR PCP:  None SOURCE: patient and wife  _________________________________   HISTORICAL  CHIEF COMPLAINT:  Chief Complaint  Patient presents with  . Multiple Sclerosis    Sts. he continues to tolerate Copaxone well.  Back pain is some worse.  Needs r/f of Hydrocodone/fim    HISTORY OF PRESENT ILLNESS:  John Sullivan is a 61 yo man with MS.    He feels stable for the most part.  However since quitting his job he feels he is thinking less clearly.            MS:   He feels stable on Copaxone every other day and tolerates it well.   His last exacerbation was around 2012 when he had diplopia that improved after IV steroid.    Gait/strength/sensation/ leg pain:  Gait is about the same.   He stumbles some but no falls.    His right leg has numbness and mild dysesthetic pain, helped by lamotrigine.   Dysesthesias worsen if he is up all day.   Pain is usually worse later in the day.  He gets some benefit from the gabapentin .   He reports lower back pain also.    He only uses hydrocodone a few times a week and is out.Marland Kitchen   [place] databse reviewed, no filled scripts last 6 months.     Bladder:   He is doing better since starting oxybutynin.   He only rarely has urgency now.  Vision:  No diplopia or other visual changes.     He wears glasses.  Fatigue/Sleep/RLS:  He has less fatigue, since stopping work but hopes to get back to work soon.    He sleeps well with night time marijuana every night.     He snores but has never had apneic symptoms.    He has no daytime sleepiness.  No naps.   He has restless leg syndrome with discomfort in the legs improved by moving around..   Gabapentin and MJ has helped a lot.   He no longer takes ropinirole..   Mood/Cognition:   He denies any significant depression or anxiety.    He has less irritability on lamotrigine.   He has very mild cognitive problems.   Occasionally,  he has word finding difficulty.      MS History:   He was diagnosed with MS in 2001 after presenting with numbness in the hands and legs and vertigo.  He saw Dr. Nicole Kindred after symptoms persisted.   He had an MRI and LP, both consistent with MS.    He went on Rebif x 18 months but did not tolerate it well. He switched to see me around 2004.   MRI's have been stable on Copaxone.  He also took low dose Naltrexone for a few days.    His sister has MS, also  REVIEW OF SYSTEMS: Constitutional: No fevers, chills, sweats, or change in appetite.    Mild fatigue Eyes: No visual changes, double vision, eye pain Ear, nose and throat: No hearing loss, ear pain, nasal congestion, sore throat Cardiovascular: No chest pain, palpitations Respiratory: No shortness of breath at rest or with exertion.   No wheezes GastrointestinaI: No nausea, vomiting, diarrhea, abdominal pain, fecal incontinence Genitourinary: No dysuria, urinary retention or frequency.  No nocturia. Musculoskeletal: No current neck pain, mils back pain Integumentary: No rash, pruritus, skin lesions Neurological: as above Psychiatric: No depression at this time.  No anxiety Endocrine: No palpitations, diaphoresis, change in appetite, change in weigh or increased thirst Hematologic/Lymphatic: No anemia, purpura, petechiae. Allergic/Immunologic: No itchy/runny eyes, nasal congestion, recent allergic reactions, rashes  ALLERGIES: No Known Allergies  HOME MEDICATIONS:  Current Outpatient Prescriptions:  .  baclofen (LIORESAL) 10 MG tablet, Take 1 tablet (10 mg total) by mouth 3 (three) times daily., Disp: 90 tablet, Rfl: 11 .  cholecalciferol (VITAMIN D) 1000 UNITS tablet, Take 1,000 Units by mouth daily., Disp: , Rfl:  .  gabapentin (NEURONTIN) 800 MG tablet, Take 1 tablet (800 mg total) by mouth 3 (three) times daily., Disp: 90 tablet, Rfl: 11 .  glatiramer (COPAXONE) 20 MG/ML SOSY injection, Inject 1 mL (20 mg total) into the skin  daily., Disp: 30 each, Rfl: 11 .  HYDROcodone-acetaminophen (NORCO/VICODIN) 5-325 MG tablet, Take 1 tablet by mouth 3 (three) times daily as needed for moderate pain., Disp: 90 tablet, Rfl: 0 .  lamoTRIgine (LAMICTAL) 100 MG tablet, Take 1 tablet (100 mg total) by mouth 2 (two) times daily., Disp: 60 tablet, Rfl: 11 .  Multiple Vitamin (MULTIVITAMIN) capsule, Take 1 capsule by mouth daily., Disp: , Rfl:  .  oxybutynin (DITROPAN) 5 MG tablet, Take 1 tablet (5 mg total) by mouth 2 (two) times daily., Disp: 60 tablet, Rfl: 11 .  TURMERIC PO, Take by mouth., Disp: , Rfl:  .  rOPINIRole (REQUIP) 1 MG tablet, Take 1 tablet (1 mg total) by mouth 2 (two) times daily. (Patient not taking: Reported on 02/08/2017), Disp: 60 tablet, Rfl: 11  PAST MEDICAL HISTORY: Past Medical History:  Diagnosis Date  . Multiple sclerosis ([place])   . Neuropathy ([place])   . Vision abnormalities     PAST SURGICAL HISTORY: Past Surgical History:  Procedure Laterality Date  . TONSILLECTOMY AND ADENOIDECTOMY    . WISDOM TOOTH EXTRACTION      FAMILY HISTORY: Family History  Problem Relation Age of Onset  . AAA (abdominal aortic aneurysm) Mother   . Diabetes Father     SOCIAL HISTORY:  Social History   Social History  . Marital status: Significant Other    Spouse name: N/A  . Number of children: N/A  . Years of education: N/A   Occupational History  . Not on file.   Social History Main Topics  . Smoking status: Current Every Day Smoker    Packs/day: 0.50    Types: Cigarettes  . Smokeless tobacco: Never Used  . Alcohol use 0.0 oz/week     Comment: rare  . Drug use: No  . Sexual activity: Not on file   Other Topics Concern  . Not on file   Social History Narrative  . No narrative on file     PHYSICAL EXAM  Vitals:   02/08/17 1517  BP: (!) 138/92  Pulse: (!) 102  Resp: 14  Weight: 154 lb (69.9 kg)  Height: _0  (1.702 m)    Body mass index is 24.12 kg/m.   General: The patient is  well-developed and well-nourished and in no acute distress  Neck: The neck is supple, no carotid bruits are noted.  The neck is nontender.   Neurologic Exam  Mental status: The patient is alert and oriented x 3 at the time of the examination. The patient has apparent normal recent and remote memory, with an apparently normal attention span and concentration ability.   Speech is normal.  Cranial nerves: Extraocular movements are full. .Facial strength is normal.  Trapezius and sternocleidomastoid strength is normal.  No dysarthria is noted.  The tongue is midline, and the patient has symmetric elevation of the soft palate. No obvious hearing deficits are noted but hearing slightly better on left.    Motor:  Muscle bulk is normal.   Tone is slightly increased in legs. Strength is  5 / 5 in all 4 extremities.   Sensory: Sensory testing is intact to soft touch and vibration sensation in all 4 extremities.  Coordination: Cerebellar testing reveals good finger-nose-finger and heel-to-shin bilaterally.  Gait and station: Station is normal.   Gait is normal. Tandem gait is wide. Romberg is negative.   Reflexes: Deep tendon reflexes are symmetric and normal bilaterally.     DIAGNOSTIC DATA (LABS, IMAGING, TESTING) - I reviewed patient records, labs, notes, testing and imaging myself where available.    ASSESSMENT AND PLAN  Multiple sclerosis (HCC)  Dysesthesia  Bilateral low back pain without sciatica, unspecified chronicity  Urinary frequency  Restless leg  Other fatigue    1.  Continue Copaxone 20 mg every other day.    Recommended MRI to determine if any subclinical progression but he would like to wait until his deductible is met.   2.  Continue gabapentin 800 mg tid and lamotrigine 100 mg po bid.    Hydrocodone when necessary (uses just 2-3 a week) 3.  Stay active and exercises as tolerated. 4.  He will return to see [place] in 4 months or call sooner if he has new or worsening  neurologic symptoms.   Keshonna Valvo A. Felecia Shelling, MD, PhD 05/11/2946, 6:54 PM Certified in Neurology, Clinical Neurophysiology, Sleep Medicine, Pain Medicine and Neuroimaging  [facility] [address] [place], [place] [address] [phone]

## 2017-04-20 ENCOUNTER — Telehealth: Payer: Self-pay | Admitting: *Deleted

## 2017-04-20 NOTE — Telephone Encounter (Signed)
Hydrocodone PA completed and submitted via Cover My Meds.  Key # [id number]. Pt. takes Hydrocodone for painful dysesthesias bilat legs.  Also currently taking Gabapentin and Lamictal, which both help. He occ. has exac. of pain which is helped by Hydrocodone/fim

## 2017-04-21 NOTE — Telephone Encounter (Signed)
LMOM for Dara (identified vm) that, prior to the March rx. for Hydrocodone, the last opioid rx. he received from our office was on 08/05/17, so he would not have filled an opioid rx. from our office w/i the last 180 days/fim

## 2017-04-21 NOTE — Telephone Encounter (Signed)
Dara/BC [phone] needs to know has the pt filled a prescription for an opoid in the past 180 days? Can LVM

## 2017-04-21 NOTE — Telephone Encounter (Signed)
correction to below--last opioid rx. from our office prior to the March rx. was on 08/05/16/fim

## 2017-04-25 NOTE — Telephone Encounter (Signed)
Fax received from [facility] [place] (phone # [phone]).  Hydrocodone PA denied, with explanation being that members filling an IR opioid for the first time w/i 180 days are limited to max of a 7 day supply/fim

## 2017-06-21 ENCOUNTER — Other Ambulatory Visit: Payer: Self-pay | Admitting: Neurology

## 2017-06-30 ENCOUNTER — Ambulatory Visit: Payer: BLUE CROSS/BLUE SHIELD | Admitting: Neurology

## 2017-07-07 ENCOUNTER — Encounter: Payer: Self-pay | Admitting: Neurology

## 2017-07-07 ENCOUNTER — Ambulatory Visit (INDEPENDENT_AMBULATORY_CARE_PROVIDER_SITE_OTHER): Payer: BLUE CROSS/BLUE SHIELD | Admitting: Neurology

## 2017-07-07 VITALS — BP 143/89 | HR 71 | Resp 16 | Ht 67.0 in | Wt 143.5 lb

## 2017-07-07 DIAGNOSIS — G2581 Restless legs syndrome: Secondary | ICD-10-CM

## 2017-07-07 DIAGNOSIS — G35 Multiple sclerosis: Secondary | ICD-10-CM | POA: Diagnosis not present

## 2017-07-07 DIAGNOSIS — R208 Other disturbances of skin sensation: Secondary | ICD-10-CM | POA: Diagnosis not present

## 2017-07-07 DIAGNOSIS — R5383 Other fatigue: Secondary | ICD-10-CM

## 2017-07-07 MED ORDER — ROPINIROLE HCL 1 MG PO TABS: 1.0000 mg | ORAL_TABLET | Freq: Two times a day (BID) | ORAL | 11 refills | Status: DC

## 2017-07-07 MED ORDER — OXYBUTYNIN CHLORIDE 5 MG PO TABS: 5.0000 mg | ORAL_TABLET | Freq: Two times a day (BID) | ORAL | 11 refills | Status: DC

## 2017-07-07 MED ORDER — HYDROCODONE-ACETAMINOPHEN 5-325 MG PO TABS: 1.0000 | ORAL_TABLET | Freq: Four times a day (QID) | ORAL | 0 refills | Status: DC | PRN

## 2017-07-07 MED ORDER — LIDOCAINE 5 % EX PTCH: 1.0000 | MEDICATED_PATCH | CUTANEOUS | 5 refills | Status: DC

## 2017-07-07 NOTE — Progress Notes (Signed)
[facility]  PATIENT: John Sullivan DOB: 03-18-1956  REFERRING DOCTOR OR PCP:  None SOURCE: patient and wife  _________________________________   HISTORICAL  CHIEF COMPLAINT:  Chief Complaint  Patient presents with  . Multiple Sclerosis    Sts. he continues to tolerate Copaxone well.  Continues to c/o intermittent electric shock sensation right leg./fim    HISTORY OF PRESENT ILLNESS:  [name] is a 61 yo man with MS.      MS:   His MS is stable.   He feels stable on Copaxone every other day and tolerates it well.   His last exacerbation was around 2012 when he had diplopia that improved after IV steroid.    Gait/strength/sensation/ leg pain:  His gait and balance are mildly off, especially if he turns quickly.  There continue to be some dysesthesias, especially in the right leg, helped by lamotrigine.   Pain is usually worse later in the day.  He gets some benefit from the gabapentin .   He reports lower back pain also.    He only uses hydrocodone a few times a week and is out.Marland Kitchen   [place] databse reviewed, no filled scripts last 6 months.     Bladder:   He feels bladder has done better oxybutynin. There is no urgency.   Vision:  No diplopia or other visual changes.     He wears glasses.  Fatigue/Sleep/RLS:  He is back to work pretty much full time.    He notes mild fatigue, mostly in the late afternoons and evenings.  He is sleeping ok at night but RLS is worse since he stopped smoking MJ every night.       He snores but has never had apneic symptoms.    He denies sleepiness and does not nap.   He has restless leg syndrome with discomfort in the legs.  Ropinirole has helped but he takes irregularly only if RLS occurs.. Gabapentin helps a lot.   He no longer takes ropinirole..   Mood/Cognition:   He denies any significant depression or anxiety.    He has less irritability on lamotrigine.   He notes some short term memory loss and difficulty with naming/names.    Other:   He quit smoking MJ  MS History:   He was diagnosed with MS in 2001 after presenting with numbness in the hands and legs and vertigo.  He saw Dr. Nicole Kindred after symptoms persisted.   He had an MRI and LP, both consistent with MS.    He went on Rebif x 18 months but did not tolerate it well. He switched to see me around 2004.   MRI's have been stable on Copaxone.   His sister has MS, also  REVIEW OF SYSTEMS: Constitutional: No fevers, chills, sweats, or change in appetite.    Mild fatigue Eyes: No visual changes, double vision, eye pain Ear, nose and throat: No hearing loss, ear pain, nasal congestion, sore throat Cardiovascular: No chest pain, palpitations Respiratory: No shortness of breath at rest or with exertion.   No wheezes GastrointestinaI: No nausea, vomiting, diarrhea, abdominal pain, fecal incontinence Genitourinary: No dysuria, urinary retention or frequency.  No nocturia. Musculoskeletal: No current neck pain, mils back pain Integumentary: No rash, pruritus, skin lesions Neurological: as above Psychiatric: No depression at this time.  No anxiety Endocrine: No palpitations, diaphoresis, change in appetite, change in weigh or increased thirst Hematologic/Lymphatic: No anemia, purpura, petechiae. Allergic/Immunologic: No itchy/runny eyes, nasal congestion, recent allergic reactions,  rashes  ALLERGIES: No Known Allergies  HOME MEDICATIONS:  Current Outpatient Prescriptions:  .  baclofen (LIORESAL) 10 MG tablet, Take 1 tablet (10 mg total) by mouth 3 (three) times daily., Disp: 90 tablet, Rfl: 11 .  cholecalciferol (VITAMIN D) 1000 UNITS tablet, Take 1,000 Units by mouth daily., Disp: , Rfl:  .  gabapentin (NEURONTIN) 800 MG tablet, TAKE 1 TABLET (800 MG TOTAL) BY MOUTH 3 (THREE) TIMES DAILY., Disp: 90 tablet, Rfl: 8 .  glatiramer (COPAXONE) 20 MG/ML SOSY injection, Inject 1 mL (20 mg total) into the skin daily., Disp: 30 each, Rfl: 11 .  HYDROcodone-acetaminophen  (NORCO/VICODIN) 5-325 MG tablet, Take 1 tablet by mouth 3 (three) times daily as needed for moderate pain., Disp: 90 tablet, Rfl: 0 .  lamoTRIgine (LAMICTAL) 100 MG tablet, TAKE 1 TABLET BY MOUTH TWICE A DAY, Disp: 60 tablet, Rfl: 6 .  Multiple Vitamin (MULTIVITAMIN) capsule, Take 1 capsule by mouth daily., Disp: , Rfl:  .  oxybutynin (DITROPAN) 5 MG tablet, Take 1 tablet (5 mg total) by mouth 2 (two) times daily., Disp: 60 tablet, Rfl: 11 .  rOPINIRole (REQUIP) 1 MG tablet, Take 1 tablet (1 mg total) by mouth 2 (two) times daily., Disp: 60 tablet, Rfl: 11 .  TURMERIC PO, Take by mouth., Disp: , Rfl:   PAST MEDICAL HISTORY: Past Medical History:  Diagnosis Date  . Multiple sclerosis ([place])   . Neuropathy   . Vision abnormalities     PAST SURGICAL HISTORY: Past Surgical History:  Procedure Laterality Date  . TONSILLECTOMY AND ADENOIDECTOMY    . WISDOM TOOTH EXTRACTION      FAMILY HISTORY: Family History  Problem Relation Age of Onset  . AAA (abdominal aortic aneurysm) Mother   . Diabetes Father     SOCIAL HISTORY:  Social History   Social History  . Marital status: Significant Other    Spouse name: N/A  . Number of children: N/A  . Years of education: N/A   Occupational History  . Not on file.   Social History Main Topics  . Smoking status: Current Every Day Smoker    Packs/day: 0.50    Types: Cigarettes  . Smokeless tobacco: Never Used  . Alcohol use 0.0 oz/week     Comment: rare  . Drug use: No  . Sexual activity: Not on file   Other Topics Concern  . Not on file   Social History Narrative  . No narrative on file     PHYSICAL EXAM  Vitals:   07/07/17 1416  BP: (!) 143/89  Pulse: 71  Resp: 16  Weight: 143 lb 8 oz (65.1 kg)  Height: 5\' 7"  (1.702 m)    Body mass index is 22.48 kg/m.   General: The patient is well-developed and well-nourished and in no acute distress  Neck: The neck is supple, no carotid bruits are noted.  The neck is  nontender.   Neurologic Exam  Mental status: The patient is alert and oriented x 3 at the time of the examination. The patient has apparent normal recent and remote memory, with an apparently normal attention span and concentration ability.   Speech is normal.  Cranial nerves: Extraocular movements are full. .Facial strength is normal.  Trapezius and sternocleidomastoid strength is normal. No dysarthria is noted.  The tongue is midline, and the patient has symmetric elevation of the soft palate. No obvious hearing deficits are noted but hearing slightly better on left.    Motor:  Muscle bulk is  normal.   Tone is slightly increased in legs. Strength is  5 / 5 in all 4 extremities.   Sensory: He has intact sensation to touch and vibration in the arms but mild asymmetry of touch sensation in the legs, less on the right.  Coordination: Cerebellar testing reveals good finger-nose-finger and heel-to-shin bilaterally.  Gait and station: Station is normal.   Gait is normal. Tandem gait is normal. Romberg is negative.   Reflexes: Deep tendon reflexes are symmetric and normal bilaterally.     DIAGNOSTIC DATA (LABS, IMAGING, TESTING) - I reviewed patient records, labs, notes, testing and imaging myself where available.    ASSESSMENT AND PLAN  Multiple sclerosis (HCC)  Dysesthesia  Restless leg  Other fatigue    1.  Continue Copaxone 20 mg every other day.    He has not wanted to do an MRI due to high co-pay.  2.  For the dysesthetic pain and RLS and he will continue gabapentin, lamotrigine and prn hydrocodone.  3.  Stay active and exercises as tolerated. 4.  He will return to see [place] in 4 months or call sooner if he has new or worsening neurologic symptoms.   Richard A. Felecia Shelling, MD, PhD 03/06/2877, 6:76 PM Certified in Neurology, Clinical Neurophysiology, Sleep Medicine, Pain Medicine and Neuroimaging  [facility] [address] [place], [place] [address] [phone]   m

## 2017-08-09 ENCOUNTER — Telehealth: Payer: Self-pay | Admitting: Neurology

## 2017-08-09 MED ORDER — VARENICLINE TARTRATE 1 MG PO TABS: 1.0000 mg | ORAL_TABLET | Freq: Two times a day (BID) | ORAL | 0 refills | Status: DC

## 2017-08-09 MED ORDER — VARENICLINE TARTRATE 0.5 MG PO TABS: 0.5000 mg | ORAL_TABLET | Freq: Two times a day (BID) | ORAL | 0 refills | Status: DC

## 2017-08-09 NOTE — Telephone Encounter (Signed)
Pt's wife called he would like RX for chantix called to [facility]. Pt is aware he may have to be seen prior to getting RX

## 2017-08-09 NOTE — Telephone Encounter (Signed)
Per RAS, ok for Chantix 0.5mg  bid for one month, then Chantix 1mg  bid for 3 mos.  John Sullivan aware and rx. escribed [facility] per his request/fim

## 2017-08-24 ENCOUNTER — Encounter: Payer: Self-pay | Admitting: *Deleted

## 2017-11-08 ENCOUNTER — Ambulatory Visit: Payer: BLUE CROSS/BLUE SHIELD | Admitting: Neurology

## 2017-12-27 ENCOUNTER — Telehealth: Payer: Self-pay | Admitting: Neurology

## 2017-12-27 MED ORDER — VARENICLINE TARTRATE 0.5 MG X 11 & 1 MG X 42 PO MISC: ORAL | 0 refills | Status: DC

## 2017-12-27 NOTE — Telephone Encounter (Signed)
Pt. was taken Chantix, has been off since Christmas. Per RAS ok, new rx. faxed to [facility]/fim

## 2017-12-27 NOTE — Addendum Note (Signed)
Addended by: France Ravens I on: 12/27/2017 02:54 PM   Modules accepted: Orders

## 2017-12-27 NOTE — Telephone Encounter (Signed)
Patient's wife calling requesting Rx for varenicline (CHANTIX) 1 MG tablet.

## 2018-02-14 ENCOUNTER — Other Ambulatory Visit: Payer: Self-pay | Admitting: Neurology

## 2018-02-15 ENCOUNTER — Other Ambulatory Visit: Payer: Self-pay | Admitting: Neurology

## 2018-03-23 ENCOUNTER — Ambulatory Visit: Payer: Self-pay | Admitting: Neurology

## 2018-04-27 ENCOUNTER — Encounter: Payer: Self-pay | Admitting: Neurology

## 2018-04-27 ENCOUNTER — Ambulatory Visit: Payer: BLUE CROSS/BLUE SHIELD | Admitting: Neurology

## 2018-04-27 ENCOUNTER — Encounter

## 2018-04-27 ENCOUNTER — Other Ambulatory Visit: Payer: Self-pay

## 2018-04-27 VITALS — BP 134/83 | HR 83 | Resp 16 | Ht 67.0 in | Wt 151.0 lb

## 2018-04-27 DIAGNOSIS — M5416 Radiculopathy, lumbar region: Secondary | ICD-10-CM

## 2018-04-27 DIAGNOSIS — R208 Other disturbances of skin sensation: Secondary | ICD-10-CM | POA: Diagnosis not present

## 2018-04-27 DIAGNOSIS — G35 Multiple sclerosis: Secondary | ICD-10-CM | POA: Diagnosis not present

## 2018-04-27 DIAGNOSIS — G2581 Restless legs syndrome: Secondary | ICD-10-CM | POA: Diagnosis not present

## 2018-04-27 DIAGNOSIS — R350 Frequency of micturition: Secondary | ICD-10-CM | POA: Diagnosis not present

## 2018-04-27 MED ORDER — METHYLPREDNISOLONE 4 MG PO TBPK: ORAL_TABLET | ORAL | 0 refills | Status: DC

## 2018-04-27 MED ORDER — HYDROCODONE-ACETAMINOPHEN 5-325 MG PO TABS: 1.0000 | ORAL_TABLET | Freq: Four times a day (QID) | ORAL | 0 refills | Status: DC | PRN

## 2018-04-27 MED ORDER — OXYBUTYNIN CHLORIDE 5 MG PO TABS: 5.0000 mg | ORAL_TABLET | Freq: Two times a day (BID) | ORAL | 11 refills | Status: DC

## 2018-04-27 NOTE — Progress Notes (Signed)
[facility]  PATIENT: John Sullivan DOB: Apr 29, 1956  REFERRING DOCTOR OR PCP:  None SOURCE: patient and wife  _________________________________   HISTORICAL  CHIEF COMPLAINT:  Chief Complaint  Patient presents with  . Multiple Sclerosis    Sts. he continues to tolerate Copaxone well.  Sts. he is having more sharp electric sensations "like a cattle prod" in right leg, occasionally left let. Has been under more stress-- father recently passed away/fim    HISTORY OF PRESENT ILLNESS:  John Sullivan is a 62 yo man with MS.      Update 04/27/2018: He reports more right leg pain.   He gets shooting radicular pain a few times a day from 90 seconds to 5 minutes.    Episodes are triggered by standing up from bed or while walking.  Pain is less likely to occur if walking and leaning forward.   Pain is like a 'cattle prod'.  Pain was very intermittent until 4-5 months ago.   His left leg occasionally has pain though it is usually milder.     He has not had any imaging.        His MS seems to be doing well.    He is on Copaxone qod and tolerates it well.    He notes mild issues with his gait and has no recent falls but some stumbles.    Lamotrigine and gabapentin has helped the dysesthetic pains.     Bladder frequency is better with oxybutynin.  Fatigue bothes him after work but not at work.   He feels the RLS has been better since adding ropinirole to gabapentin.          From 07/07/2017:  MS:   His MS is stable.   He feels stable on Copaxone every other day and tolerates it well.   His last exacerbation was around 2012 when he had diplopia that improved after IV steroid.    Gait/strength/sensation/ leg pain:  His gait and balance are mildly off, especially if he turns quickly.  There continue to be some dysesthesias, especially in the right leg, helped by lamotrigine.   Pain is usually worse later in the day.  He gets some benefit from the gabapentin .   He reports lower  back pain also.    He only uses hydrocodone a few times a week and is out.Marland Kitchen   [place] databse reviewed, no filled scripts last 6 months.     Bladder:   He feels bladder has done better oxybutynin. There is no urgency.   Vision:  No diplopia or other visual changes.     He wears glasses.  Fatigue/Sleep/RLS:  He is back to work pretty much full time.    He notes mild fatigue, mostly in the late afternoons and evenings.  He is sleeping ok at night but RLS is worse since he stopped smoking MJ every night.       He snores but has never had apneic symptoms.    He denies sleepiness and does not nap.   He has restless leg syndrome with discomfort in the legs.  Ropinirole has helped but he takes irregularly only if RLS occurs.. Gabapentin helps a lot.   He no longer takes ropinirole..   Mood/Cognition:   He denies any significant depression or anxiety.    He has less irritability on lamotrigine.   He notes some short term memory loss and difficulty with naming/names.   Other:   He quit smoking MJ  MS History:   He was diagnosed with MS in 2001 after presenting with numbness in the hands and legs and vertigo.  He saw Dr. Nicole Kindred after symptoms persisted.   He had an MRI and LP, both consistent with MS.    He went on Rebif x 18 months but did not tolerate it well. He switched to see me around 2004.   MRI's have been stable on Copaxone.   His sister has MS, also  REVIEW OF SYSTEMS: Constitutional: No fevers, chills, sweats, or change in appetite.    Mild fatigue Eyes: No visual changes, double vision, eye pain Ear, nose and throat: No hearing loss, ear pain, nasal congestion, sore throat Cardiovascular: No chest pain, palpitations Respiratory: No shortness of breath at rest or with exertion.   No wheezes GastrointestinaI: No nausea, vomiting, diarrhea, abdominal pain, fecal incontinence Genitourinary: No dysuria, urinary retention or frequency.  No nocturia. Musculoskeletal: No current neck pain, mils  back pain Integumentary: No rash, pruritus, skin lesions Neurological: as above Psychiatric: No depression at this time.  No anxiety Endocrine: No palpitations, diaphoresis, change in appetite, change in weigh or increased thirst Hematologic/Lymphatic: No anemia, purpura, petechiae. Allergic/Immunologic: No itchy/runny eyes, nasal congestion, recent allergic reactions, rashes  ALLERGIES: No Known Allergies  HOME MEDICATIONS:  Current Outpatient Medications:  .  baclofen (LIORESAL) 10 MG tablet, Take 1 tablet (10 mg total) by mouth 3 (three) times daily., Disp: 90 tablet, Rfl: 11 .  cholecalciferol (VITAMIN D) 1000 UNITS tablet, Take 1,000 Units by mouth daily., Disp: , Rfl:  .  gabapentin (NEURONTIN) 800 MG tablet, TAKE 1 TABLET (800 MG TOTAL) BY MOUTH 3 (THREE) TIMES DAILY., Disp: 90 tablet, Rfl: 8 .  glatiramer (COPAXONE) 20 MG/ML SOSY injection, Inject 1 mL (20 mg total) into the skin daily., Disp: 30 each, Rfl: 11 .  HYDROcodone-acetaminophen (NORCO/VICODIN) 5-325 MG tablet, Take 1 tablet by mouth every 6 (six) hours as needed for moderate pain., Disp: 28 tablet, Rfl: 0 .  lamoTRIgine (LAMICTAL) 100 MG tablet, TAKE 1 TABLET BY MOUTH TWICE A DAY, Disp: 180 tablet, Rfl: 1 .  lidocaine (LIDODERM) 5 %, Place 1 patch onto the skin daily. Remove & Discard patch within 12 hours or as directed by MD, Disp: 30 patch, Rfl: 5 .  Multiple Vitamin (MULTIVITAMIN) capsule, Take 1 capsule by mouth daily., Disp: , Rfl:  .  oxybutynin (DITROPAN) 5 MG tablet, Take 1 tablet (5 mg total) by mouth 2 (two) times daily., Disp: 60 tablet, Rfl: 11 .  rOPINIRole (REQUIP) 1 MG tablet, Take 1 tablet (1 mg total) by mouth 2 (two) times daily., Disp: 60 tablet, Rfl: 11 .  TURMERIC PO, Take by mouth., Disp: , Rfl:  .  varenicline (CHANTIX STARTING MONTH PAK) 0.5 MG X 11 & 1 MG X 42 tablet, TAKE AS DIRECTED ON PACKAGE, Disp: 53 tablet, Rfl: 0 .  methylPREDNISolone (MEDROL DOSEPAK) 4 MG TBPK tablet, Taper from 6 pills  po qd to 1 over 6 days, Disp: 21 tablet, Rfl: 0  PAST MEDICAL HISTORY: Past Medical History:  Diagnosis Date  . Multiple sclerosis ([place])   . Neuropathy   . Vision abnormalities     PAST SURGICAL HISTORY: Past Surgical History:  Procedure Laterality Date  . TONSILLECTOMY AND ADENOIDECTOMY    . WISDOM TOOTH EXTRACTION      FAMILY HISTORY: Family History  Problem Relation Age of Onset  . AAA (abdominal aortic aneurysm) Mother   . Diabetes Father     SOCIAL  HISTORY:  Social History   Socioeconomic History  . Marital status: Significant Other    Spouse name: Not on file  . Number of children: Not on file  . Years of education: Not on file  . Highest education level: Not on file  Occupational History  . Not on file  Social Needs  . Financial resource strain: Not on file  . Food insecurity:    Worry: Not on file    Inability: Not on file  . Transportation needs:    Medical: Not on file    Non-medical: Not on file  Tobacco Use  . Smoking status: Current Every Day Smoker    Packs/day: 0.50    Types: Cigarettes  . Smokeless tobacco: Never Used  Substance and Sexual Activity  . Alcohol use: Yes    Alcohol/week: 0.0 oz    Comment: rare  . Drug use: No  . Sexual activity: Not on file  Lifestyle  . Physical activity:    Days per week: Not on file    Minutes per session: Not on file  . Stress: Not on file  Relationships  . Social connections:    Talks on phone: Not on file    Gets together: Not on file    Attends religious service: Not on file    Active member of club or organization: Not on file    Attends meetings of clubs or organizations: Not on file    Relationship status: Not on file  . Intimate partner violence:    Fear of current or ex partner: Not on file    Emotionally abused: Not on file    Physically abused: Not on file    Forced sexual activity: Not on file  Other Topics Concern  . Not on file  Social History Narrative  . Not on file      PHYSICAL EXAM  Vitals:   04/27/18 1402  BP: 134/83  Pulse: 83  Resp: 16  Weight: 151 lb (68.5 kg)  Height: 5\' 7"  (1.702 m)    Body mass index is 23.65 kg/m.   General: The patient is well-developed and well-nourished and in no acute distress  Musculoskeletal: .  The neck is nontender.  He has mild tenderness over the piriformis muscle more than the paraspinal muscles on the right.   Neurologic Exam  Mental status: The patient is alert and oriented x 3 at the time of the examination. The patient has apparent normal recent and remote memory, with an apparently normal attention span and concentration ability.   Speech is normal.  Cranial nerves: Extraocular movements are full. .Facial strength is normal.  Trapezius and sternocleidomastoid strength is normal. No dysarthria is noted.  The tongue is midline, and the patient has symmetric elevation of the soft palate. No obvious hearing deficits are noted but hearing slightly better on left.    Motor:  Muscle bulk is normal.  Muscle tone is slightly increased in the legs.  Strength is 5/5 in the arms and legs.  Sensory: He has intact sensation to touch and vibration in the arms but mild asymmetry of touch sensation in the legs, less on the right.  Coordination: Cerebellar testing reveals good finger-nose-finger and heel-to-shin bilaterally.  Gait and station: Station is normal.   Gait is normal.  Tandem gait is wide.  Romberg is negative.  Reflexes: Deep tendon reflexes are symmetric and normal bilaterally.   Other:    Straight leg raise cause a shooting radicular pain on the  right at 80 degrees.         DIAGNOSTIC DATA (LABS, IMAGING, TESTING) - I reviewed patient records, labs, notes, testing and imaging myself where available.    ASSESSMENT AND PLAN  Multiple sclerosis (HCC)  Urinary frequency  Dysesthesia  Restless leg  Lumbar radiculopathy    1.  Continue Copaxone 20 mg every other day.    He has not  wanted to do an MRI due to high co-pay.  2.  For the dysesthetic pain and RLS and he will continue gabapentin, lamotrigine and prn hydrocodone.  3.  He likely has a right radiculopathy.   Will call in a steroid pack and advised to exercise.    If not better, check lumbar MRI.    4.  Renew hydrocodone.  The [place] controlled substance database was reviewed.  His last refill was August 2018 and he has not received opiates from any other health provider.   5.   He will return to see [place] in 4 months or call sooner if he has new or worsening neurologic symptoms.   Tien Spooner A. Felecia Shelling, MD, PhD 08/30/4627, 6:38 PM Certified in Neurology, Clinical Neurophysiology, Sleep Medicine, Pain Medicine and Neuroimaging  [facility] [address] [place], [place] [address] [phone]   m

## 2018-05-05 ENCOUNTER — Other Ambulatory Visit: Payer: Self-pay | Admitting: Neurology

## 2018-06-29 ENCOUNTER — Other Ambulatory Visit: Payer: Self-pay | Admitting: Neurology

## 2018-08-09 ENCOUNTER — Other Ambulatory Visit: Payer: Self-pay | Admitting: Neurology

## 2018-08-30 ENCOUNTER — Ambulatory Visit: Payer: BLUE CROSS/BLUE SHIELD | Admitting: Neurology

## 2018-08-30 ENCOUNTER — Other Ambulatory Visit: Payer: Self-pay

## 2018-08-30 ENCOUNTER — Encounter: Payer: Self-pay | Admitting: Neurology

## 2018-08-30 VITALS — BP 122/80 | HR 78 | Resp 18 | Ht 67.0 in | Wt 151.0 lb

## 2018-08-30 DIAGNOSIS — R350 Frequency of micturition: Secondary | ICD-10-CM | POA: Diagnosis not present

## 2018-08-30 DIAGNOSIS — G2581 Restless legs syndrome: Secondary | ICD-10-CM

## 2018-08-30 DIAGNOSIS — R5383 Other fatigue: Secondary | ICD-10-CM | POA: Diagnosis not present

## 2018-08-30 DIAGNOSIS — G35 Multiple sclerosis: Secondary | ICD-10-CM | POA: Diagnosis not present

## 2018-08-30 DIAGNOSIS — R208 Other disturbances of skin sensation: Secondary | ICD-10-CM

## 2018-08-30 DIAGNOSIS — M5431 Sciatica, right side: Secondary | ICD-10-CM

## 2018-08-30 MED ORDER — HYDROCODONE-ACETAMINOPHEN 5-325 MG PO TABS: 1.0000 | ORAL_TABLET | Freq: Four times a day (QID) | ORAL | 0 refills | Status: DC | PRN

## 2018-08-30 MED ORDER — VARENICLINE TARTRATE 0.5 MG X 11 & 1 MG X 42 PO MISC: ORAL | 0 refills | Status: DC

## 2018-08-30 MED ORDER — OXYBUTYNIN CHLORIDE 5 MG PO TABS: 5.0000 mg | ORAL_TABLET | Freq: Two times a day (BID) | ORAL | 11 refills | Status: DC

## 2018-08-30 MED ORDER — BACLOFEN 10 MG PO TABS: 10.0000 mg | ORAL_TABLET | Freq: Three times a day (TID) | ORAL | 11 refills | Status: DC

## 2018-08-30 NOTE — Progress Notes (Signed)
[facility]  PATIENT: John Sullivan DOB: [birth year]  REFERRING DOCTOR OR PCP:  None SOURCE: patient and wife  _________________________________   HISTORICAL  CHIEF COMPLAINT:  Chief Complaint  Patient presents with  . Multiple Sclerosis    Sts. he continues to tolerate Copaxone well.  C/O increased spasticity right lower leg, more pain right hip.  He is still trying to stop smoking; would like Chantix r/f/fim    HISTORY OF PRESENT ILLNESS:  John Sullivan is a 62 y.o. man with MS.      Update 08/30/2018: He feels his MS is stable.   He is on Copaxone and tolerates it well.   No exacerbations.  He is noting more cramps and spasms in his right leg, worse at night but occurring during the day as well.   He is noting mild weakness in his legs, right > left as well.   He is walking about the same.     The right hip is bothering him.     Lamotrigine with gabapentin has helped.        Bladder is doing ok on Oxybutynin.  Vision is doing well.   No diplopia.    His fatigue fluctuates and sometimes he feels very sleepy, even at work.    A nap helps him feel better quickly.   He also has RLS helped by ropinirole.      His sister has MS and has had more cognitive issues.  She also has had a stroke.     Update 04/27/2018: He reports more right leg pain.   He gets shooting radicular pain a few times a day from 90 seconds to 5 minutes.    Episodes are triggered by standing up from bed or while walking.  Pain is less likely to occur if walking and leaning forward.   Pain is like a 'cattle prod'.  Pain was very intermittent until 4-5 months ago.   His left leg occasionally has pain though it is usually milder.     He has not had any imaging.        His MS seems to be doing well.    He is on Copaxone qod and tolerates it well.    He notes mild issues with his gait and has no recent falls but some stumbles.    Lamotrigine and gabapentin has helped the dysesthetic pains.     Bladder  frequency is better with oxybutynin.  Fatigue bothes him after work but not at work.   He feels the RLS has been better since adding ropinirole to gabapentin.          From 07/07/2017:  MS:   His MS is stable.   He feels stable on Copaxone every other day and tolerates it well.   His last exacerbation was around 2012 when he had diplopia that improved after IV steroid.    Gait/strength/sensation/ leg pain:  His gait and balance are mildly off, especially if he turns quickly.  There continue to be some dysesthesias, especially in the right leg, helped by lamotrigine.   Pain is usually worse later in the day.  He gets some benefit from the gabapentin .   He reports lower back pain also.    He only uses hydrocodone a few times a week and is out.Marland Kitchen   [place] databse reviewed, no filled scripts last 6 months.     Bladder:   He feels bladder has done better oxybutynin. There is no urgency.  Vision:  No diplopia or other visual changes.     He wears glasses.  Fatigue/Sleep/RLS:  He is back to work pretty much full time.    He notes mild fatigue, mostly in the late afternoons and evenings.  He is sleeping ok at night but RLS is worse since he stopped smoking MJ every night.       He snores but has never had apneic symptoms.    He denies sleepiness and does not nap.   He has restless leg syndrome with discomfort in the legs.  Ropinirole has helped but he takes irregularly only if RLS occurs.. Gabapentin helps a lot.   He no longer takes ropinirole..   Mood/Cognition:   He denies any significant depression or anxiety.    He has less irritability on lamotrigine.   He notes some short term memory loss and difficulty with naming/names.   Other:   He quit smoking MJ  MS History:   He was diagnosed with MS in 2001 after presenting with numbness in the hands and legs and vertigo.  He saw Dr. Nicole Kindred after symptoms persisted.   He had an MRI and LP, both consistent with MS.    He went on Rebif x 18 months but did  not tolerate it well. He switched to see me around 2004.   MRI's have been stable on Copaxone.   His sister has MS, also  REVIEW OF SYSTEMS: Constitutional: No fevers, chills, sweats, or change in appetite.    Mild fatigue Eyes: No visual changes, double vision, eye pain Ear, nose and throat: No hearing loss, ear pain, nasal congestion, sore throat Cardiovascular: No chest pain, palpitations Respiratory: No shortness of breath at rest or with exertion.   No wheezes GastrointestinaI: No nausea, vomiting, diarrhea, abdominal pain, fecal incontinence Genitourinary: No dysuria, urinary retention or frequency.  No nocturia. Musculoskeletal: No current neck pain, mils back pain Integumentary: No rash, pruritus, skin lesions Neurological: as above Psychiatric: No depression at this time.  No anxiety Endocrine: No palpitations, diaphoresis, change in appetite, change in weigh or increased thirst Hematologic/Lymphatic: No anemia, purpura, petechiae. Allergic/Immunologic: No itchy/runny eyes, nasal congestion, recent allergic reactions, rashes  ALLERGIES: No Known Allergies  HOME MEDICATIONS:  Current Outpatient Medications:  .  baclofen (LIORESAL) 10 MG tablet, Take 1 tablet (10 mg total) by mouth 3 (three) times daily., Disp: 90 tablet, Rfl: 11 .  cholecalciferol (VITAMIN D) 1000 UNITS tablet, Take 1,000 Units by mouth daily., Disp: , Rfl:  .  gabapentin (NEURONTIN) 800 MG tablet, TAKE 1 TABLET BY MOUTH THREE TIMES A DAY, Disp: 270 tablet, Rfl: 3 .  glatiramer (COPAXONE) 20 MG/ML SOSY injection, Inject 1 mL (20 mg total) into the skin daily., Disp: 30 each, Rfl: 11 .  HYDROcodone-acetaminophen (NORCO/VICODIN) 5-325 MG tablet, Take 1 tablet by mouth every 6 (six) hours as needed for moderate pain., Disp: 30 tablet, Rfl: 0 .  lamoTRIgine (LAMICTAL) 100 MG tablet, TAKE 1 TABLET BY MOUTH TWICE A DAY, Disp: 180 tablet, Rfl: 1 .  lidocaine (LIDODERM) 5 %, Place 1 patch onto the skin daily. Remove  & Discard patch within 12 hours or as directed by MD, Disp: 30 patch, Rfl: 5 .  methylPREDNISolone (MEDROL DOSEPAK) 4 MG TBPK tablet, Taper from 6 pills po qd to 1 over 6 days, Disp: 21 tablet, Rfl: 0 .  Multiple Vitamin (MULTIVITAMIN) capsule, Take 1 capsule by mouth daily., Disp: , Rfl:  .  oxybutynin (DITROPAN) 5 MG tablet, Take 1  tablet (5 mg total) by mouth 2 (two) times daily., Disp: 60 tablet, Rfl: 11 .  rOPINIRole (REQUIP) 1 MG tablet, Take 1 tablet (1 mg total) by mouth 2 (two) times daily., Disp: 60 tablet, Rfl: 11 .  TURMERIC PO, Take by mouth., Disp: , Rfl:  .  varenicline (CHANTIX STARTING MONTH PAK) 0.5 MG X 11 & 1 MG X 42 tablet, TAKE AS DIRECTED ON PACKAGE, Disp: 53 tablet, Rfl: 0  PAST MEDICAL HISTORY: Past Medical History:  Diagnosis Date  . Multiple sclerosis ([place])   . Neuropathy   . Vision abnormalities     PAST SURGICAL HISTORY: Past Surgical History:  Procedure Laterality Date  . TONSILLECTOMY AND ADENOIDECTOMY    . WISDOM TOOTH EXTRACTION      FAMILY HISTORY: Family History  Problem Relation Age of Onset  . AAA (abdominal aortic aneurysm) Mother   . Diabetes Father     SOCIAL HISTORY:  Social History   Socioeconomic History  . Marital status: Significant Other    Spouse name: Not on file  . Number of children: Not on file  . Years of education: Not on file  . Highest education level: Not on file  Occupational History  . Not on file  Social Needs  . Financial resource strain: Not on file  . Food insecurity:    Worry: Not on file    Inability: Not on file  . Transportation needs:    Medical: Not on file    Non-medical: Not on file  Tobacco Use  . Smoking status: Current Every Day Smoker    Packs/day: 0.50    Types: Cigarettes  . Smokeless tobacco: Never Used  Substance and Sexual Activity  . Alcohol use: Yes    Alcohol/week: 0.0 standard drinks    Comment: rare  . Drug use: No  . Sexual activity: Not on file  Lifestyle  . Physical  activity:    Days per week: Not on file    Minutes per session: Not on file  . Stress: Not on file  Relationships  . Social connections:    Talks on phone: Not on file    Gets together: Not on file    Attends religious service: Not on file    Active member of club or organization: Not on file    Attends meetings of clubs or organizations: Not on file    Relationship status: Not on file  . Intimate partner violence:    Fear of current or ex partner: Not on file    Emotionally abused: Not on file    Physically abused: Not on file    Forced sexual activity: Not on file  Other Topics Concern  . Not on file  Social History Narrative  . Not on file     PHYSICAL EXAM  Vitals:   08/30/18 1429  BP: 122/80  Pulse: 78  Resp: 18  Weight: 151 lb (68.5 kg)  Height: 5\' 7"  (1.702 m)    Body mass index is 23.65 kg/m.   General: The patient is well-developed and well-nourished and in no acute distress  Musculoskeletal: .  The neck is nontender.  He has marked tenderness over the right piriformis muscle     Neurologic Exam  Mental status: The patient is alert and oriented x 3 at the time of the examination. The patient has apparent normal recent and remote memory, with an apparently normal attention span and concentration ability.   Speech is normal.  Cranial nerves: Extraocular  movements are full. .Facial strength is normal.  Trapezius and sternocleidomastoid strength is normal. No dysarthria is noted.  The tongue is midline, and the patient has symmetric elevation of the soft palate. No obvious hearing deficits are noted but hearing slightly better on left.    Motor:  Muscle bulk is normal.  Muscle tone is slightly increased in the legs.  Strength is 5/5 in the arms and legs.  Sensory: He has intact sensation to touch and vibration in the arms but mild asymmetry of touch sensation in the legs, less on the right.  Coordination: Cerebellar testing reveals good finger-nose-finger and  heel-to-shin bilaterally.  Gait and station: Station is normal.   Gait is normal.  Tandem gait is wide.  Romberg is negative.  Reflexes: Deep tendon reflexes are symmetric and normal bilaterally.        DIAGNOSTIC DATA (LABS, IMAGING, TESTING) - I reviewed patient records, labs, notes, testing and imaging myself where available.    ASSESSMENT AND PLAN  Multiple sclerosis (HCC)  Other fatigue  Dysesthesia  Urinary frequency  Right sided sciatica  Restless leg    1.  Continue Copaxone 20 mg every other day.    He prefers not to do an MRI due to high co-pay.  2.  For the dysesthetic pain and RLS and he will continue gabapentin, lamotrigine and prn hydrocodone.  3.  Right piriformis TPI with 80 mg Depo-medrol in 3 cc Marcaine using sterile technique.      4.  Renew hydrocodone.  The [place] controlled substance database was reviewed.  His last refill was August 2018 and he has not received opiates from any other health provider.   5.   He will return to see [place] in 4 months or call sooner if he has new or worsening neurologic symptoms.   Sara Keys A. Felecia Shelling, MD, PhD 1/88/4166, 0:63 PM Certified in Neurology, Clinical Neurophysiology, Sleep Medicine, Pain Medicine and Neuroimaging  [facility] [address] [place], [place] [address] [phone]   m

## 2018-09-26 ENCOUNTER — Other Ambulatory Visit: Payer: Self-pay | Admitting: Neurology

## 2018-10-23 ENCOUNTER — Other Ambulatory Visit: Payer: Self-pay | Admitting: Neurology

## 2018-11-06 ENCOUNTER — Telehealth: Payer: Self-pay | Admitting: Neurology

## 2018-11-06 NOTE — Telephone Encounter (Signed)
I called pt. He states pharmacy says they have no refills. I called [facility]. They forgot to place refills on rx escribed back on 06/29/18 for #[id number], refills.I called pt to let him know. He verbalized understanding.

## 2018-11-06 NOTE — Telephone Encounter (Signed)
Pt has called for a refill on his gabapentin (NEURONTIN) 800 MG tablet  [id number]

## 2019-01-03 ENCOUNTER — Ambulatory Visit: Payer: BLUE CROSS/BLUE SHIELD | Admitting: Neurology

## 2019-01-03 ENCOUNTER — Encounter: Payer: Self-pay | Admitting: Neurology

## 2019-01-03 VITALS — BP 110/70 | HR 85 | Ht 67.0 in | Wt 153.0 lb

## 2019-01-03 DIAGNOSIS — R350 Frequency of micturition: Secondary | ICD-10-CM

## 2019-01-03 DIAGNOSIS — G35 Multiple sclerosis: Secondary | ICD-10-CM | POA: Diagnosis not present

## 2019-01-03 DIAGNOSIS — R0683 Snoring: Secondary | ICD-10-CM | POA: Diagnosis not present

## 2019-01-03 DIAGNOSIS — R5383 Other fatigue: Secondary | ICD-10-CM | POA: Diagnosis not present

## 2019-01-03 DIAGNOSIS — G4719 Other hypersomnia: Secondary | ICD-10-CM

## 2019-01-03 MED ORDER — OXYBUTYNIN CHLORIDE 5 MG PO TABS: 5.0000 mg | ORAL_TABLET | Freq: Two times a day (BID) | ORAL | 11 refills | Status: DC

## 2019-01-03 NOTE — Progress Notes (Signed)
[facility]  PATIENT: John Sullivan DOB: 15-Apr-1956  REFERRING DOCTOR OR PCP:  None SOURCE: patient and wife  _________________________________   HISTORICAL  CHIEF COMPLAINT:  Chief Complaint  Patient presents with  . Follow-up    RM 12, with wife.     HISTORY OF PRESENT ILLNESS:  John Sullivan is a 63 y.o. man with MS.      Update 01/03/2019: He is on Copaxone and tolerates it well.    No exacerbations.   He denies any new neurologic symptoms form the MS but is much more fatigued.   He is also dozing off a lot more.   Gait is fine.   No stumbles.   Strength and sensation is fine.   At the last visit, he ws having piriformis pain and the injection has helped a lot.    He snores and Joaquim Lai has noted pauses in his breathing and some gasps, especially on his back.  He dozes off most days.     EPWORTH SLEEPINESS SCALE  On a scale of 0 - 3 what is the chance of dozing:  Sitting and Reading:   3 Watching TV:    3 Sitting inactive in a public place:  3 Passenger in car for one hour:  3 Lying down to rest in the afternoon: 3 Sitting and talking to someone:  1 Sitting quietly after lunch:  3 In a car, stopped in traffic:  0  Total (out of 24):   19/24   Severe daytime sleepiness   Update 08/30/2018: He feels his MS is stable.   He is on Copaxone and tolerates it well.   No exacerbations.  He is noting more cramps and spasms in his right leg, worse at night but occurring during the day as well.   He is noting mild weakness in his legs, right > left as well.   He is walking about the same.     The right hip is bothering him.     Lamotrigine with gabapentin has helped.        Bladder is doing ok on Oxybutynin.  Vision is doing well.   No diplopia.    His fatigue fluctuates and sometimes he feels very sleepy, even at work.    A nap helps him feel better quickly.   He also has RLS helped by ropinirole.      His sister has MS and has had more cognitive issues.   She also has had a stroke.     Update 04/27/2018: He reports more right leg pain.   He gets shooting radicular pain a few times a day from 90 seconds to 5 minutes.    Episodes are triggered by standing up from bed or while walking.  Pain is less likely to occur if walking and leaning forward.   Pain is like a 'cattle prod'.  Pain was very intermittent until 4-5 months ago.   His left leg occasionally has pain though it is usually milder.     He has not had any imaging.        His MS seems to be doing well.    He is on Copaxone qod and tolerates it well.    He notes mild issues with his gait and has no recent falls but some stumbles.    Lamotrigine and gabapentin has helped the dysesthetic pains.     Bladder frequency is better with oxybutynin.  Fatigue bothes him after work but not at work.  He feels the RLS has been better since adding ropinirole to gabapentin.          From 07/07/2017:  MS:   His MS is stable.   He feels stable on Copaxone every other day and tolerates it well.   His last exacerbation was around 2012 when he had diplopia that improved after IV steroid.    Gait/strength/sensation/ leg pain:  His gait and balance are mildly off, especially if he turns quickly.  There continue to be some dysesthesias, especially in the right leg, helped by lamotrigine.   Pain is usually worse later in the day.  He gets some benefit from the gabapentin .   He reports lower back pain also.    He only uses hydrocodone a few times a week and is out.Marland Kitchen   [place] databse reviewed, no filled scripts last 6 months.     Bladder:   He feels bladder has done better oxybutynin. There is no urgency.   Vision:  No diplopia or other visual changes.     He wears glasses.  Fatigue/Sleep/RLS:  He is back to work pretty much full time.    He notes mild fatigue, mostly in the late afternoons and evenings.  He is sleeping ok at night but RLS is worse since he stopped smoking MJ every night.       He snores but has never  had apneic symptoms.    He denies sleepiness and does not nap.   He has restless leg syndrome with discomfort in the legs.  Ropinirole has helped but he takes irregularly only if RLS occurs.. Gabapentin helps a lot.   He no longer takes ropinirole..   Mood/Cognition:   He denies any significant depression or anxiety.    He has less irritability on lamotrigine.   He notes some short term memory loss and difficulty with naming/names.   Other:   He quit smoking MJ  MS History:   He was diagnosed with MS in 2001 after presenting with numbness in the hands and legs and vertigo.  He saw Dr. Nicole Kindred after symptoms persisted.   He had an MRI and LP, both consistent with MS.    He went on Rebif x 18 months but did not tolerate it well. He switched to see me around 2004.   MRI's have been stable on Copaxone.   His sister has MS, also  REVIEW OF SYSTEMS: Constitutional: No fevers, chills, sweats, or change in appetite.    Mild fatigue Eyes: No visual changes, double vision, eye pain Ear, nose and throat: No hearing loss, ear pain, nasal congestion, sore throat Cardiovascular: No chest pain, palpitations Respiratory: No shortness of breath at rest or with exertion.   No wheezes GastrointestinaI: No nausea, vomiting, diarrhea, abdominal pain, fecal incontinence Genitourinary: No dysuria, urinary retention or frequency.  No nocturia. Musculoskeletal: No current neck pain, mils back pain Integumentary: No rash, pruritus, skin lesions Neurological: as above Psychiatric: No depression at this time.  No anxiety Endocrine: No palpitations, diaphoresis, change in appetite, change in weigh or increased thirst Hematologic/Lymphatic: No anemia, purpura, petechiae. Allergic/Immunologic: No itchy/runny eyes, nasal congestion, recent allergic reactions, rashes  ALLERGIES: No Known Allergies  HOME MEDICATIONS:  Current Outpatient Medications:  .  baclofen (LIORESAL) 10 MG tablet, Take 1 tablet (10 mg total)  by mouth 3 (three) times daily., Disp: 90 tablet, Rfl: 11 .  cholecalciferol (VITAMIN D) 1000 UNITS tablet, Take 1,000 Units by mouth daily., Disp: , Rfl:  .  gabapentin (NEURONTIN) 800 MG tablet, TAKE 1 TABLET BY MOUTH THREE TIMES A DAY, Disp: 270 tablet, Rfl: 3 .  glatiramer (COPAXONE) 20 MG/ML SOSY injection, Inject 1 mL (20 mg total) into the skin daily., Disp: 30 each, Rfl: 11 .  HYDROcodone-acetaminophen (NORCO/VICODIN) 5-325 MG tablet, Take 1 tablet by mouth every 6 (six) hours as needed for moderate pain., Disp: 30 tablet, Rfl: 0 .  lamoTRIgine (LAMICTAL) 100 MG tablet, TAKE 1 TABLET BY MOUTH TWICE A DAY, Disp: 180 tablet, Rfl: 1 .  methylPREDNISolone (MEDROL DOSEPAK) 4 MG TBPK tablet, Taper from 6 pills po qd to 1 over 6 days, Disp: 21 tablet, Rfl: 0 .  Multiple Vitamin (MULTIVITAMIN) capsule, Take 1 capsule by mouth daily., Disp: , Rfl:  .  oxybutynin (DITROPAN) 5 MG tablet, Take 1 tablet (5 mg total) by mouth 2 (two) times daily., Disp: 60 tablet, Rfl: 11 .  rOPINIRole (REQUIP) 1 MG tablet, Take 1 tablet (1 mg total) by mouth 2 (two) times daily., Disp: 60 tablet, Rfl: 11 .  TURMERIC PO, Take by mouth., Disp: , Rfl:  .  varenicline (CHANTIX STARTING MONTH PAK) 0.5 MG X 11 & 1 MG X 42 tablet, TAKE AS DIRECTED ON PACKAGE, Disp: 53 tablet, Rfl: 0  PAST MEDICAL HISTORY: Past Medical History:  Diagnosis Date  . Multiple sclerosis ([place])   . Neuropathy   . Vision abnormalities     PAST SURGICAL HISTORY: Past Surgical History:  Procedure Laterality Date  . TONSILLECTOMY AND ADENOIDECTOMY    . WISDOM TOOTH EXTRACTION      FAMILY HISTORY: Family History  Problem Relation Age of Onset  . AAA (abdominal aortic aneurysm) Mother   . Diabetes Father     SOCIAL HISTORY:  Social History   Socioeconomic History  . Marital status: Significant Other    Spouse name: Not on file  . Number of children: Not on file  . Years of education: Not on file  . Highest education level: Not on  file  Occupational History  . Not on file  Social Needs  . Financial resource strain: Not on file  . Food insecurity:    Worry: Not on file    Inability: Not on file  . Transportation needs:    Medical: Not on file    Non-medical: Not on file  Tobacco Use  . Smoking status: Current Every Day Smoker    Packs/day: 0.50    Types: Cigarettes  . Smokeless tobacco: Never Used  Substance and Sexual Activity  . Alcohol use: Yes    Alcohol/week: 0.0 standard drinks    Comment: rare  . Drug use: No  . Sexual activity: Not on file  Lifestyle  . Physical activity:    Days per week: Not on file    Minutes per session: Not on file  . Stress: Not on file  Relationships  . Social connections:    Talks on phone: Not on file    Gets together: Not on file    Attends religious service: Not on file    Active member of club or organization: Not on file    Attends meetings of clubs or organizations: Not on file    Relationship status: Not on file  . Intimate partner violence:    Fear of current or ex partner: Not on file    Emotionally abused: Not on file    Physically abused: Not on file    Forced sexual activity: Not on file  Other Topics Concern  .  Not on file  Social History Narrative  . Not on file     PHYSICAL EXAM  Vitals:   01/03/19 1436  BP: 110/70  Pulse: 85  Weight: 153 lb (69.4 kg)  Height: 5\' 7"  (1.702 m)    Body mass index is 23.96 kg/m.   General: The patient is well-developed and well-nourished and in no acute distress  Musculoskeletal: .  The neck is nontender.  He has marked tenderness over the right piriformis muscle     Neurologic Exam  Mental status: The patient is alert and oriented x 3 at the time of the examination. The patient has apparent normal recent and remote memory, with an apparently normal attention span and concentration ability.   Speech is normal.  Cranial nerves: Extraocular movements are full. .Facial strength is normal.  Trapezius  and sternocleidomastoid strength is normal. No dysarthria is noted.  The tongue is midline, and the patient has symmetric elevation of the soft palate. No obvious hearing deficits are noted but hearing slightly better on left.    Motor:  Muscle bulk is normal.  Muscle tone is slightly increased in the legs.  Strength is 5/5 in the arms and legs.  Sensory: He has intact sensation to touch and vibration in the arms but mild asymmetry of touch sensation in the legs, less on the right.  Coordination: Cerebellar testing reveals good finger-nose-finger and heel-to-shin bilaterally.  Gait and station: Station is normal.   Gait is normal.  Tandem gait is wide.  Romberg is negative.  Reflexes: Deep tendon reflexes are symmetric and normal bilaterally.        DIAGNOSTIC DATA (LABS, IMAGING, TESTING) - I reviewed patient records, labs, notes, testing and imaging myself where available.    ASSESSMENT AND PLAN  Multiple sclerosis ([facility])  Other fatigue  Urinary frequency  Snoring  Excessive daytime sleepiness    1.  Continue Copaxone 20 mg every other day. 2.  For the dysesthetic pain and RLS and he will continue gabapentin, lamotrigine and prn hydrocodone.  3.  PSG split night for snoring with EDS.   If OSA will treat with PAP but if normal, consider Nuvigil.       4. He will return to see [place] in 4 months or call sooner if he has new or worsening neurologic symptoms.   Arianna Delsanto A. Felecia Shelling, MD, PhD 08/29/2682, 4:19 PM Certified in Neurology, Clinical Neurophysiology, Sleep Medicine, Pain Medicine and Neuroimaging  [facility] [address] [place], [place] [address] [phone]

## 2019-01-22 ENCOUNTER — Telehealth: Payer: Self-pay

## 2019-01-22 NOTE — Telephone Encounter (Signed)
We have attempted to call the patient two times to schedule sleep study.  Patient has been unavailable at the phone numbers we have on file. Voicemail is full and we are unable to leave a message.  Did speak with wife on 01/16/2019 and left message with her.  At this point we will send a letter asking patient to please contact the sleep lab to schedule their sleep study.  If patient calls back we will schedule them for their sleep study.

## 2019-01-29 ENCOUNTER — Other Ambulatory Visit: Payer: Self-pay | Admitting: Neurology

## 2019-04-09 ENCOUNTER — Telehealth: Payer: Self-pay | Admitting: *Deleted

## 2019-04-09 NOTE — Telephone Encounter (Signed)
Spoke with pt. Updated med list/pharmacy/allergies on file for VV tomorrow. Emailed him at: [email or web address] for [email or web address] VV. Asked him to call back if he does not receive email.

## 2019-04-10 ENCOUNTER — Other Ambulatory Visit: Payer: Self-pay

## 2019-04-10 ENCOUNTER — Encounter: Payer: Self-pay | Admitting: Neurology

## 2019-04-10 ENCOUNTER — Ambulatory Visit (INDEPENDENT_AMBULATORY_CARE_PROVIDER_SITE_OTHER): Payer: BLUE CROSS/BLUE SHIELD | Admitting: Neurology

## 2019-04-10 DIAGNOSIS — G35 Multiple sclerosis: Secondary | ICD-10-CM

## 2019-04-10 DIAGNOSIS — G4719 Other hypersomnia: Secondary | ICD-10-CM | POA: Diagnosis not present

## 2019-04-10 DIAGNOSIS — G2581 Restless legs syndrome: Secondary | ICD-10-CM

## 2019-04-10 DIAGNOSIS — M545 Low back pain, unspecified: Secondary | ICD-10-CM

## 2019-04-10 DIAGNOSIS — M5431 Sciatica, right side: Secondary | ICD-10-CM | POA: Diagnosis not present

## 2019-04-10 MED ORDER — HYDROCODONE-ACETAMINOPHEN 5-325 MG PO TABS: 1.0000 | ORAL_TABLET | Freq: Four times a day (QID) | ORAL | 0 refills | Status: DC | PRN

## 2019-04-10 MED ORDER — PREDNISONE 10 MG (21) PO TBPK: ORAL_TABLET | ORAL | 0 refills | Status: DC

## 2019-04-10 NOTE — Progress Notes (Signed)
[facility]  PATIENT: John Sullivan DOB: [birth year]  REFERRING DOCTOR OR PCP:  None SOURCE: patient and wife  _________________________________   HISTORICAL  CHIEF COMPLAINT:  Chief Complaint  Patient presents with   Multiple Sclerosis    On Copaxone   Back Pain    worsening   Other    RLS; dysesthesia, possible OS    HISTORY OF PRESENT ILLNESS:  John Sullivan is a 63 y.o. man with MS.     Update 04/10/2019: Virtual Visit via Video Note I connected with Ileene Patrick on 04/10/19 at  3:30 PM EDT by a video enabled telemedicine application and verified that I am speaking with the correct person.  I discussed the limitations of evaluation and management by telemedicine and the availability of in person appointments. The patient expressed understanding and agreed to proceed.  History of Present Illness:   He is noting more LBP, worse when sitting a while.   He takes hydrocodone if pain is more severe.     He has some insomnia and RLS, helped by gabapentin.    He has EDS but ahs   Observations/Objective: He is a well-developed well-nourished woman in no acute distress.  The head is normocephalic and atraumatic.  Sclera are anicteric.  Visible skin appears normal.  The neck has a good range of motion.     He is alert and fully oriented with fluent speech and good attention, knowledge and memory.  Extraocular muscles are intact.  Facial strength is normal.    He appears to have normal strength in the arms.  Rapid alternating movements and finger-nose-finger are performed well.  He appears to be walking the same as last visit  Assessment and Plan: Multiple sclerosis ([place])  Right sided sciatica  Bilateral low back pain without sciatica, unspecified chronicity  Excessive daytime sleepiness  Restless leg  1.  Continue Copaxone. 2.  Low back pain is acting up.  In the past a piriformis muscle injection helped for many months.  I will call in a steroid  pack.  If pain does not improve we can work him into the office sometime soon.  Renew hydrocodone. 3.  Continue lamotrigine and gabapentin for dysesthesias, restless legs. 4.  Stay active exercise as tolerated.  Try to socially distance at work and follow CDC guidance as possible 5.  He has snoring and EDS.  We have discussed a sleep study but he would like to hold off at this time.   6.  Return to see me in 4 months or sooner if there are new or worsening neurologic symptoms.   Follow Up Instructions: I discussed the assessment and treatment plan with the patient. The patient was provided an opportunity to ask questions and all were answered. The patient agreed with the plan and demonstrated an understanding of the instructions.    The patient was advised to call back or seek an in-person evaluation if the symptoms worsen or if the condition fails to improve as anticipated.  I provided 25 minutes of non-face-to-face time during this encounter.  ___________________________ From previous visits: Update 01/03/2019: He is on Copaxone and tolerates it well.    No exacerbations.   He denies any new neurologic symptoms form the MS but is much more fatigued.   He is also dozing off a lot more.   Gait is fine.   No stumbles.   Strength and sensation is fine.   At the last visit, he ws having piriformis pain and  the injection has helped a lot.    He snores and Joaquim Lai has noted pauses in his breathing and some gasps, especially on his back.  He dozes off most days.     EPWORTH SLEEPINESS SCALE  On a scale of 0 - 3 what is the chance of dozing:  Sitting and Reading:   3 Watching TV:    3 Sitting inactive in a public place:  3 Passenger in car for one hour:  3 Lying down to rest in the afternoon: 3 Sitting and talking to someone:  1 Sitting quietly after lunch:  3 In a car, stopped in traffic:  0  Total (out of 24):   19/24   Severe daytime sleepiness   Update 08/30/2018: He feels his MS is  stable.   He is on Copaxone and tolerates it well.   No exacerbations.  He is noting more cramps and spasms in his right leg, worse at night but occurring during the day as well.   He is noting mild weakness in his legs, right > left as well.   He is walking about the same.     The right hip is bothering him.     Lamotrigine with gabapentin has helped.        Bladder is doing ok on Oxybutynin.  Vision is doing well.   No diplopia.    His fatigue fluctuates and sometimes he feels very sleepy, even at work.    A nap helps him feel better quickly.   He also has RLS helped by ropinirole.      His sister has MS and has had more cognitive issues.  She also has had a stroke.     Update 04/27/2018: He reports more right leg pain.   He gets shooting radicular pain a few times a day from 90 seconds to 5 minutes.    Episodes are triggered by standing up from bed or while walking.  Pain is less likely to occur if walking and leaning forward.   Pain is like a 'cattle prod'.  Pain was very intermittent until 4-5 months ago.   His left leg occasionally has pain though it is usually milder.     He has not had any imaging.        His MS seems to be doing well.    He is on Copaxone qod and tolerates it well.    He notes mild issues with his gait and has no recent falls but some stumbles.    Lamotrigine and gabapentin has helped the dysesthetic pains.     Bladder frequency is better with oxybutynin.  Fatigue bothes him after work but not at work.   He feels the RLS has been better since adding ropinirole to gabapentin.          From 07/07/2017:  MS:   His MS is stable.   He feels stable on Copaxone every other day and tolerates it well.   His last exacerbation was around 2012 when he had diplopia that improved after IV steroid.    Gait/strength/sensation/ leg pain:  His gait and balance are mildly off, especially if he turns quickly.  There continue to be some dysesthesias, especially in the right leg, helped by  lamotrigine.   Pain is usually worse later in the day.  He gets some benefit from the gabapentin .   He reports lower back pain also.    He only uses hydrocodone a few times a week  and is out.Marland Kitchen   [place] databse reviewed, no filled scripts last 6 months.     Bladder:   He feels bladder has done better oxybutynin. There is no urgency.   Vision:  No diplopia or other visual changes.     He wears glasses.  Fatigue/Sleep/RLS:  He is back to work pretty much full time.    He notes mild fatigue, mostly in the late afternoons and evenings.  He is sleeping ok at night but RLS is worse since he stopped smoking MJ every night.       He snores but has never had apneic symptoms.    He denies sleepiness and does not nap.   He has restless leg syndrome with discomfort in the legs.  Ropinirole has helped but he takes irregularly only if RLS occurs.. Gabapentin helps a lot.   He no longer takes ropinirole..   Mood/Cognition:   He denies any significant depression or anxiety.    He has less irritability on lamotrigine.   He notes some short term memory loss and difficulty with naming/names.   Other:   He quit smoking MJ  MS History:   He was diagnosed with MS in 2001 after presenting with numbness in the hands and legs and vertigo.  He saw Dr. Nicole Kindred after symptoms persisted.   He had an MRI and LP, both consistent with MS.    He went on Rebif x 18 months but did not tolerate it well. He switched to see me around 2004.   MRI's have been stable on Copaxone.   His sister has MS, also  REVIEW OF SYSTEMS: Constitutional: No fevers, chills, sweats, or change in appetite.    Mild fatigue Eyes: No visual changes, double vision, eye pain Ear, nose and throat: No hearing loss, ear pain, nasal congestion, sore throat Cardiovascular: No chest pain, palpitations Respiratory: No shortness of breath at rest or with exertion.   No wheezes GastrointestinaI: No nausea, vomiting, diarrhea, abdominal pain, fecal  incontinence Genitourinary: No dysuria, urinary retention or frequency.  No nocturia. Musculoskeletal: No current neck pain, mils back pain Integumentary: No rash, pruritus, skin lesions Neurological: as above Psychiatric: No depression at this time.  No anxiety Endocrine: No palpitations, diaphoresis, change in appetite, change in weigh or increased thirst Hematologic/Lymphatic: No anemia, purpura, petechiae. Allergic/Immunologic: No itchy/runny eyes, nasal congestion, recent allergic reactions, rashes  ALLERGIES: No Known Allergies  HOME MEDICATIONS:  Current Outpatient Medications:    Ascorbic Acid (VITAMIN C PO), Take 1 Dose by mouth daily., Disp: , Rfl:    baclofen (LIORESAL) 10 MG tablet, Take 1 tablet (10 mg total) by mouth 3 (three) times daily., Disp: 90 tablet, Rfl: 11   cholecalciferol (VITAMIN D) 1000 UNITS tablet, Take 1,000 Units by mouth daily., Disp: , Rfl:    gabapentin (NEURONTIN) 800 MG tablet, TAKE 1 TABLET BY MOUTH THREE TIMES A DAY, Disp: 270 tablet, Rfl: 3   glatiramer (COPAXONE) 20 MG/ML SOSY injection, Inject 1 mL (20 mg total) into the skin daily., Disp: 30 each, Rfl: 11   HYDROcodone-acetaminophen (NORCO/VICODIN) 5-325 MG tablet, Take 1 tablet by mouth every 6 (six) hours as needed for moderate pain., Disp: 30 tablet, Rfl: 0   lamoTRIgine (LAMICTAL) 100 MG tablet, TAKE 1 TABLET BY MOUTH TWICE A DAY, Disp: 180 tablet, Rfl: 1   Multiple Vitamin (MULTIVITAMIN) capsule, Take 1 capsule by mouth daily., Disp: , Rfl:    oxybutynin (DITROPAN) 5 MG tablet, Take 1 tablet (5 mg total) by  mouth 2 (two) times daily., Disp: 60 tablet, Rfl: 11   predniSONE (STERAPRED UNI-PAK 21 TAB) 10 MG (21) TBPK tablet, 6 pills po x 1 day, then 5 pills po x 1 day, then 4 pills po x 1 day, then 3 pills po x 1 day, then 2 pills po x 1 day, then 1 pill po x 1 day, Disp: 21 tablet, Rfl: 0   VITAMIN E PO, Take 1 Dose by mouth daily., Disp: , Rfl:   PAST MEDICAL HISTORY: Past  Medical History:  Diagnosis Date   Multiple sclerosis ([place])    Neuropathy    Vision abnormalities     PAST SURGICAL HISTORY: Past Surgical History:  Procedure Laterality Date   TONSILLECTOMY AND ADENOIDECTOMY     WISDOM TOOTH EXTRACTION      FAMILY HISTORY: Family History  Problem Relation Age of Onset   AAA (abdominal aortic aneurysm) Mother    Diabetes Father     SOCIAL HISTORY:  Social History   Socioeconomic History   Marital status: Significant Other    Spouse name: Not on file   Number of children: Not on file   Years of education: Not on file   Highest education level: Not on file  Occupational History   Not on file  Social Needs   Financial resource strain: Not on file   Food insecurity:    Worry: Not on file    Inability: Not on file   Transportation needs:    Medical: Not on file    Non-medical: Not on file  Tobacco Use   Smoking status: Current Every Day Smoker    Packs/day: 0.50    Types: Cigarettes   Smokeless tobacco: Never Used  Substance and Sexual Activity   Alcohol use: Yes    Alcohol/week: 0.0 standard drinks    Comment: rare   Drug use: No   Sexual activity: Not on file  Lifestyle   Physical activity:    Days per week: Not on file    Minutes per session: Not on file   Stress: Not on file  Relationships   Social connections:    Talks on phone: Not on file    Gets together: Not on file    Attends religious service: Not on file    Active member of club or organization: Not on file    Attends meetings of clubs or organizations: Not on file    Relationship status: Not on file   Intimate partner violence:    Fear of current or ex partner: Not on file    Emotionally abused: Not on file    Physically abused: Not on file    Forced sexual activity: Not on file  Other Topics Concern   Not on file  Social History Narrative   Not on file     PHYSICAL EXAM  There were no vitals filed for this  visit.  There is no height or weight on file to calculate BMI.   General: The patient is well-developed and well-nourished and in no acute distress  Musculoskeletal: .  The neck is nontender.  He has marked tenderness over the right piriformis muscle     Neurologic Exam  Mental status: The patient is alert and oriented x 3 at the time of the examination. The patient has apparent normal recent and remote memory, with an apparently normal attention span and concentration ability.   Speech is normal.  Cranial nerves: Extraocular movements are full. .Facial strength is normal.  Trapezius and sternocleidomastoid strength is normal. No dysarthria is noted.  The tongue is midline, and the patient has symmetric elevation of the soft palate. No obvious hearing deficits are noted but hearing slightly better on left.    Motor:  Muscle bulk is normal.  Muscle tone is slightly increased in the legs.  Strength is 5/5 in the arms and legs.  Sensory: He has intact sensation to touch and vibration in the arms but mild asymmetry of touch sensation in the legs, less on the right.  Coordination: Cerebellar testing reveals good finger-nose-finger and heel-to-shin bilaterally.  Gait and station: Station is normal.   Gait is normal.  Tandem gait is wide.  Romberg is negative.  Reflexes: Deep tendon reflexes are symmetric and normal bilaterally.        DIAGNOSTIC DATA (LABS, IMAGING, TESTING) - I reviewed patient records, labs, notes, testing and imaging myself where available.    ASSESSMENT AND PLAN  Multiple sclerosis ([place])  Right sided sciatica  Bilateral low back pain without sciatica, unspecified chronicity  Excessive daytime sleepiness  Restless leg     Vale Peraza A. Felecia Shelling, MD, PhD 12/11/9676, 9:38 PM Certified in Neurology, Clinical Neurophysiology, Sleep Medicine, Pain Medicine and Neuroimaging  [facility] [address], [place], [place] [address] [phone]

## 2019-07-27 ENCOUNTER — Other Ambulatory Visit: Payer: Self-pay | Admitting: Neurology

## 2019-08-20 ENCOUNTER — Other Ambulatory Visit: Payer: Self-pay | Admitting: Neurology

## 2019-10-24 ENCOUNTER — Other Ambulatory Visit: Payer: Self-pay

## 2019-10-24 ENCOUNTER — Encounter: Payer: Self-pay | Admitting: Neurology

## 2019-10-24 ENCOUNTER — Ambulatory Visit: Payer: BLUE CROSS/BLUE SHIELD | Admitting: Neurology

## 2019-10-24 VITALS — BP 149/88 | HR 89 | Temp 97.6°F | Wt 139.0 lb

## 2019-10-24 DIAGNOSIS — G2581 Restless legs syndrome: Secondary | ICD-10-CM

## 2019-10-24 DIAGNOSIS — R208 Other disturbances of skin sensation: Secondary | ICD-10-CM

## 2019-10-24 DIAGNOSIS — R350 Frequency of micturition: Secondary | ICD-10-CM | POA: Diagnosis not present

## 2019-10-24 DIAGNOSIS — G35 Multiple sclerosis: Secondary | ICD-10-CM

## 2019-10-24 DIAGNOSIS — M5416 Radiculopathy, lumbar region: Secondary | ICD-10-CM | POA: Diagnosis not present

## 2019-10-24 MED ORDER — HYDROCODONE-ACETAMINOPHEN 5-325 MG PO TABS: 1.0000 | ORAL_TABLET | Freq: Four times a day (QID) | ORAL | 0 refills | Status: DC | PRN

## 2019-10-24 MED ORDER — OXYBUTYNIN CHLORIDE 5 MG PO TABS: 5.0000 mg | ORAL_TABLET | Freq: Two times a day (BID) | ORAL | 11 refills | Status: DC

## 2019-10-24 MED ORDER — PREDNISONE 10 MG (21) PO TBPK: ORAL_TABLET | ORAL | 0 refills | Status: DC

## 2019-10-24 MED ORDER — BACLOFEN 10 MG PO TABS: 10.0000 mg | ORAL_TABLET | Freq: Three times a day (TID) | ORAL | 0 refills | Status: DC

## 2019-10-24 MED ORDER — GABAPENTIN 800 MG PO TABS: 800.0000 mg | ORAL_TABLET | Freq: Three times a day (TID) | ORAL | 1 refills | Status: DC

## 2019-10-24 NOTE — Progress Notes (Signed)
[facility]  PATIENT: John Sullivan DOB: 22-Mar-1956  REFERRING DOCTOR OR PCP:  None SOURCE: patient and wife  _________________________________   HISTORICAL  CHIEF COMPLAINT:  Chief Complaint  Patient presents with  . Follow-up    Rm 12 with wife sts has been doing well~ balance has some what decreased but thinks this could be r/t to his back pain.     HISTORY OF PRESENT ILLNESS:  Ness Vanessen is a 63 y.o. man with MS.     Update 10/24/2019: MS is stable.   No exacerbations.   He has stable mild gait disturbance and urinary frequency.  He has not had any exacerbations since near the onset of his MS.  He is on glatiramer but does not take on a regular basis.  He is having more right leg pain and back pain.   Pain often acts up when he drives or right afterwards.    In the past, a trigger point injection helped for 2 months.  Another time a prednisone pack helped.    Hydrocodone helps.  He has insomnia.    Gabapentin helps the restless leg syndrome.   He uses MJ some nights which usually helps the insomnia.          Update 01/03/2019: He is on Copaxone and tolerates it well.    No exacerbations.   He denies any new neurologic symptoms form the MS but is much more fatigued.   He is also dozing off a lot more.   Gait is fine.   No stumbles.   Strength and sensation is fine.   At the last visit, he ws having piriformis pain and the injection has helped a lot.    He snores and Joaquim Lai has noted pauses in his breathing and some gasps, especially on his back.  He dozes off most days.     EPWORTH SLEEPINESS SCALE  On a scale of 0 - 3 what is the chance of dozing:  Sitting and Reading:   3 Watching TV:    3 Sitting inactive in a public place:  3 Passenger in car for one hour:  3 Lying down to rest in the afternoon: 3 Sitting and talking to someone:  1 Sitting quietly after lunch:  3 In a car, stopped in traffic:  0  Total (out of 24):   19/24   Severe  daytime sleepiness   Update 08/30/2018: He feels his MS is stable.   He is on Copaxone and tolerates it well.   No exacerbations.  He is noting more cramps and spasms in his right leg, worse at night but occurring during the day as well.   He is noting mild weakness in his legs, right > left as well.   He is walking about the same.     The right hip is bothering him.     Lamotrigine with gabapentin has helped.        Bladder is doing ok on Oxybutynin.  Vision is doing well.   No diplopia.    His fatigue fluctuates and sometimes he feels very sleepy, even at work.    A nap helps him feel better quickly.   He also has RLS helped by ropinirole.      His sister has MS and has had more cognitive issues.  She also has had a stroke.     Update 04/27/2018: He reports more right leg pain.   He gets shooting radicular pain a few times  a day from 90 seconds to 5 minutes.    Episodes are triggered by standing up from bed or while walking.  Pain is less likely to occur if walking and leaning forward.   Pain is like a 'cattle prod'.  Pain was very intermittent until 4-5 months ago.   His left leg occasionally has pain though it is usually milder.     He has not had any imaging.        His MS seems to be doing well.    He is on Copaxone qod and tolerates it well.    He notes mild issues with his gait and has no recent falls but some stumbles.    Lamotrigine and gabapentin has helped the dysesthetic pains.     Bladder frequency is better with oxybutynin.  Fatigue bothes him after work but not at work.   He feels the RLS has been better since adding ropinirole to gabapentin.          From 07/07/2017:  MS:   His MS is stable.   He feels stable on Copaxone every other day and tolerates it well.   His last exacerbation was around 2012 when he had diplopia that improved after IV steroid.    Gait/strength/sensation/ leg pain:  His gait and balance are mildly off, especially if he turns quickly.  There continue to be  some dysesthesias, especially in the right leg, helped by lamotrigine.   Pain is usually worse later in the day.  He gets some benefit from the gabapentin .   He reports lower back pain also.    He only uses hydrocodone a few times a week and is out.Marland Kitchen   [place] databse reviewed, no filled scripts last 6 months.     Bladder:   He feels bladder has done better oxybutynin. There is no urgency.   Vision:  No diplopia or other visual changes.     He wears glasses.  Fatigue/Sleep/RLS:  He is back to work pretty much full time.    He notes mild fatigue, mostly in the late afternoons and evenings.  He is sleeping ok at night but RLS is worse since he stopped smoking MJ every night.       He snores but has never had apneic symptoms.    He denies sleepiness and does not nap.   He has restless leg syndrome with discomfort in the legs.  Ropinirole has helped but he takes irregularly only if RLS occurs.. Gabapentin helps a lot.   He no longer takes ropinirole..   Mood/Cognition:   He denies any significant depression or anxiety.    He has less irritability on lamotrigine.   He notes some short term memory loss and difficulty with naming/names.   Other:   He quit smoking MJ  MS History:   He was diagnosed with MS in 2001 after presenting with numbness in the hands and legs and vertigo.  He saw Dr. Nicole Kindred after symptoms persisted.   He had an MRI and LP, both consistent with MS.    He went on Rebif x 18 months but did not tolerate it well. He switched to see me around 2004.   MRI's have been stable on Copaxone.   His sister has MS, also  REVIEW OF SYSTEMS: Constitutional: No fevers, chills, sweats, or change in appetite.    Mild fatigue Eyes: No visual changes, double vision, eye pain Ear, nose and throat: No hearing loss, ear pain, nasal congestion,  sore throat Cardiovascular: No chest pain, palpitations Respiratory: No shortness of breath at rest or with exertion.   No wheezes GastrointestinaI: No nausea,  vomiting, diarrhea, abdominal pain, fecal incontinence Genitourinary: No dysuria, urinary retention or frequency.  No nocturia. Musculoskeletal: No current neck pain, mils back pain Integumentary: No rash, pruritus, skin lesions Neurological: as above Psychiatric: No depression at this time.  No anxiety Endocrine: No palpitations, diaphoresis, change in appetite, change in weigh or increased thirst Hematologic/Lymphatic: No anemia, purpura, petechiae. Allergic/Immunologic: No itchy/runny eyes, nasal congestion, recent allergic reactions, rashes  ALLERGIES: No Known Allergies  HOME MEDICATIONS:  Current Outpatient Medications:  .  Ascorbic Acid (VITAMIN C PO), Take 1 Dose by mouth daily., Disp: , Rfl:  .  baclofen (LIORESAL) 10 MG tablet, Take 1 tablet (10 mg total) by mouth 3 (three) times daily., Disp: 270 tablet, Rfl: 0 .  cholecalciferol (VITAMIN D) 1000 UNITS tablet, Take 1,000 Units by mouth daily., Disp: , Rfl:  .  gabapentin (NEURONTIN) 800 MG tablet, Take 1 tablet (800 mg total) by mouth 3 (three) times daily., Disp: 270 tablet, Rfl: 1 .  glatiramer (COPAXONE) 20 MG/ML SOSY injection, Inject 1 mL (20 mg total) into the skin daily., Disp: 30 each, Rfl: 11 .  HYDROcodone-acetaminophen (NORCO/VICODIN) 5-325 MG tablet, Take 1 tablet by mouth every 6 (six) hours as needed for moderate pain., Disp: 30 tablet, Rfl: 0 .  lamoTRIgine (LAMICTAL) 100 MG tablet, TAKE 1 TABLET BY MOUTH TWICE A DAY, Disp: 180 tablet, Rfl: 1 .  Multiple Vitamin (MULTIVITAMIN) capsule, Take 1 capsule by mouth daily., Disp: , Rfl:  .  oxybutynin (DITROPAN) 5 MG tablet, Take 1 tablet (5 mg total) by mouth 2 (two) times daily., Disp: 60 tablet, Rfl: 11 .  VITAMIN E PO, Take 1 Dose by mouth daily., Disp: , Rfl:  .  predniSONE (STERAPRED UNI-PAK 21 TAB) 10 MG (21) TBPK tablet, 6 pills po x 1 day, then 5 pills po x 1 day, then 4 pills po x 1 day, then 3 pills po x 1 day, then 2 pills po x 1 day, then 1 pill po x 1  day, Disp: 21 tablet, Rfl: 0  PAST MEDICAL HISTORY: Past Medical History:  Diagnosis Date  . Multiple sclerosis ([place])   . Neuropathy   . Vision abnormalities     PAST SURGICAL HISTORY: Past Surgical History:  Procedure Laterality Date  . TONSILLECTOMY AND ADENOIDECTOMY    . WISDOM TOOTH EXTRACTION      FAMILY HISTORY: Family History  Problem Relation Age of Onset  . AAA (abdominal aortic aneurysm) Mother   . Diabetes Father     SOCIAL HISTORY:  Social History   Socioeconomic History  . Marital status: Significant Other    Spouse name: Not on file  . Number of children: Not on file  . Years of education: Not on file  . Highest education level: Not on file  Occupational History  . Not on file  Social Needs  . Financial resource strain: Not on file  . Food insecurity    Worry: Not on file    Inability: Not on file  . Transportation needs    Medical: Not on file    Non-medical: Not on file  Tobacco Use  . Smoking status: Current Every Day Smoker    Packs/day: 0.50    Types: Cigarettes  . Smokeless tobacco: Never Used  Substance and Sexual Activity  . Alcohol use: Yes    Alcohol/week: 0.0 standard  drinks    Comment: rare  . Drug use: No  . Sexual activity: Not on file  Lifestyle  . Physical activity    Days per week: Not on file    Minutes per session: Not on file  . Stress: Not on file  Relationships  . Social Herbalist on phone: Not on file    Gets together: Not on file    Attends religious service: Not on file    Active member of club or organization: Not on file    Attends meetings of clubs or organizations: Not on file    Relationship status: Not on file  . Intimate partner violence    Fear of current or ex partner: Not on file    Emotionally abused: Not on file    Physically abused: Not on file    Forced sexual activity: Not on file  Other Topics Concern  . Not on file  Social History Narrative  . Not on file     PHYSICAL  EXAM  Vitals:   10/24/19 1508  BP: (!) 149/88  Pulse: 89  Temp: 97.6 F (36.4 C)  TempSrc: Temporal  Weight: 139 lb (63 kg)    Body mass index is 21.77 kg/m.   General: The patient is well-developed and well-nourished and in no acute distress  Musculoskeletal: .  The neck is nontender.  He has mild left greater than right piriformis muscle tenderness right now.  This is better than last visit.  Neurologic Exam  Mental status: The patient is alert and oriented x 3 at the time of the examination. The patient has apparent normal recent and remote memory, with an apparently normal attention span and concentration ability.   Speech is normal.  Cranial nerves: Extraocular movements are full. .Facial strength is normal.  Trapezius and sternocleidomastoid strength is normal. No dysarthria is noted.  The tongue is midline, and the patient has symmetric elevation of the soft palate. No obvious hearing deficits are noted but hearing slightly better on left.    Motor:  Muscle bulk is normal.  Muscle tone is slightly increased in the legs.  Strength is 5/5 in the arms and legs except 4+/5 right EHL muscle  Sensory: He has intact sensation to touch and vibration in the arms but mild asymmetry of touch sensation in the legs, less on the right.  Coordination: Cerebellar testing reveals good finger-nose-finger and heel-to-shin bilaterally.  Gait and station: Station is normal.   Gait is arthritic.  Tandem gait is wide..  Romberg is negative.  Reflexes: Deep tendon reflexes are symmetric and normal bilaterally.          ASSESSMENT AND PLAN  Lumbar radiculopathy - Plan: MR LUMBAR SPINE WO CONTRAST  Multiple sclerosis ([place]) - Plan: MR BRAIN WO CONTRAST  Urinary frequency  Dysesthesia  Restless leg    1.   His MS has been stable for many years.  He has taken less of the Copaxone over the last year.  We discussed stopping the medication.  I will get a week baseline MRI and if no major  new changes or were the 5-year interval or so since his last MRI he will stop the Copaxone.  We discussed in about a year  or so check another MRI to make sure that there is no activity off of the Copaxone. 2.   Low back pain and right lumbosacral radiculopathy is worse.  I will send in a steroid pack and we will  check an MRI of the lumbar spine.  He may need an epidural or referral to surgery based on the results.    He takes hydrocodone sparingly.  I will renew. 3.    Continue lamotrigine and gabapentin for dysesthesias and restless legs.  4.    Stay active and exercise as tolerated.  5.    Return in 4 months or sooner if there are new or worsening neurologic symptoms.  Destyn Schuyler A. Felecia Shelling, MD, PhD [id number], [id number] PM Certified in Neurology, Clinical Neurophysiology, Sleep Medicine, Pain Medicine and Neuroimaging  [facility] [address] [place], [place] [address] [phone]

## 2020-01-09 ENCOUNTER — Telehealth: Payer: Self-pay | Admitting: Neurology

## 2020-01-09 NOTE — Telephone Encounter (Signed)
Pt's Sgo. Other, John Sullivan on DPR called stating that the pt is having a lot of pain in the R leg making it difficult to walk. She also states that he is needing the MRI. Please advise.

## 2020-01-10 NOTE — Telephone Encounter (Signed)
Spoke to patient John Sullivan on [facility] and the patient is scheduled for Wed. 01/16/20 at [facility]. BC[employer] Auth: [id number] (exp. 11/05/19 to 05/02/20)   Just an FYI She also wanted to let you know that the prednisone did work and he was able to work..she also wanted to know if things do not start to clear up for him to get on disability.

## 2020-01-14 ENCOUNTER — Other Ambulatory Visit: Payer: Self-pay | Admitting: Neurology

## 2020-01-14 MED ORDER — HYDROCODONE-ACETAMINOPHEN 5-325 MG PO TABS: 1.0000 | ORAL_TABLET | Freq: Four times a day (QID) | ORAL | 0 refills | Status: DC | PRN

## 2020-01-14 NOTE — Telephone Encounter (Signed)
Last refill for Hydrocodone was 10/24/2019 # 30 refilled by Dr. Felecia Shelling. Pt has been complaint with his f/u's.

## 2020-01-14 NOTE — Telephone Encounter (Signed)
Pt is requesting a refill of HYDROcodone-acetaminophen (NORCO/VICODIN) 5-325 MG tablet, [id number] [place],  - [address], [address] AT [facility] [address]

## 2020-01-16 ENCOUNTER — Ambulatory Visit: Payer: BC Managed Care – PPO

## 2020-01-16 ENCOUNTER — Other Ambulatory Visit: Payer: Self-pay

## 2020-01-16 DIAGNOSIS — M5416 Radiculopathy, lumbar region: Secondary | ICD-10-CM

## 2020-01-16 DIAGNOSIS — G35 Multiple sclerosis: Secondary | ICD-10-CM

## 2020-01-21 ENCOUNTER — Telehealth: Payer: Self-pay | Admitting: *Deleted

## 2020-01-21 DIAGNOSIS — M5416 Radiculopathy, lumbar region: Secondary | ICD-10-CM

## 2020-01-21 NOTE — Telephone Encounter (Signed)
Called, LVM for pt on home number to call office about results.   Tried calling John Sullivan on [place] at [phone] x2. Number not in service

## 2020-01-21 NOTE — Telephone Encounter (Signed)
Called and spoke with pt about results per Dr. Felecia Shelling note. He is agreeable to try ESI first. I placed order. Aware he will be called to get scheduled.

## 2020-01-21 NOTE — Telephone Encounter (Signed)
-----   Message from Britt Bottom, MD sent at 01/18/2020 12:02 PM EST ----- Please let him know: MRI of the brain looked ok -- he has MS lesions but the total number is not high.  MRI lumbar spine shows  herniated disc at L3L4 that could compress the L4 nerve root  If pain not better, we can refer to an ESI (ESI for RIGHT L4 RADICULOPATHY) and if the benefit is mild or short lived would refer to neurosurgery

## 2020-01-21 NOTE — Telephone Encounter (Signed)
Pt has called Terrence Dupont, RN back please call

## 2020-01-21 NOTE — Addendum Note (Signed)
Addended by: Hope Pigeon on: 01/21/2020 03:46 PM   Modules accepted: Orders

## 2020-01-23 ENCOUNTER — Other Ambulatory Visit: Payer: Self-pay

## 2020-01-27 ENCOUNTER — Other Ambulatory Visit: Payer: Self-pay | Admitting: Neurology

## 2020-01-28 ENCOUNTER — Ambulatory Visit
Admission: RE | Admit: 2020-01-28 | Discharge: 2020-01-28 | Disposition: A | Discharge status: Home / Self Care | Payer: BC Managed Care – PPO | Source: Ambulatory Visit | Attending: Neurology | Admitting: Neurology

## 2020-01-28 ENCOUNTER — Other Ambulatory Visit: Payer: Self-pay

## 2020-01-28 DIAGNOSIS — M5416 Radiculopathy, lumbar region: Secondary | ICD-10-CM

## 2020-01-28 MED ORDER — IOPAMIDOL (ISOVUE-M 200) INJECTION 41%
1.0000 mL | Freq: Once | INTRAMUSCULAR | Status: AC
  Administered 2020-01-28: 15:00:00 1 mL via EPIDURAL

## 2020-01-28 MED ORDER — METHYLPREDNISOLONE ACETATE 40 MG/ML INJ SUSP (RADIOLOG
120.0000 mg | Freq: Once | INTRAMUSCULAR | Status: AC
  Administered 2020-01-28: 15:00:00 120 mg via EPIDURAL

## 2020-01-28 NOTE — Discharge Instructions (Signed)

## 2020-01-29 ENCOUNTER — Telehealth: Payer: Self-pay | Admitting: Neurology

## 2020-01-29 NOTE — Telephone Encounter (Signed)
Called mobile and LVM for pt to call office back.  I tried home # x3 but each time it said number not in service

## 2020-01-29 NOTE — Telephone Encounter (Signed)
Dr. Sater- please advise 

## 2020-01-29 NOTE — Telephone Encounter (Signed)
Already on hydrocodne and gabapentin.   If there was no benefit from the [facility] we need to refer to neurosurgery

## 2020-01-29 NOTE — Telephone Encounter (Signed)
1) Medication(s) Requested (by name): HYDROcodone-acetaminophen (NORCO/VICODIN) 5-325 MG tablet   2) [id number] [place], [place] - [address], [address] AT [facility] [address]  [address], [address]   3) Special Requests:  Patient SO states patient is not feeling any relief with the medication and would like to know if the dose can be upped or medication be replaced with something a bit stronger.

## 2020-01-31 ENCOUNTER — Other Ambulatory Visit: Payer: Self-pay | Admitting: Neurology

## 2020-01-31 MED ORDER — HYDROCODONE-ACETAMINOPHEN 5-325 MG PO TABS: 1.0000 | ORAL_TABLET | Freq: Four times a day (QID) | ORAL | 0 refills | Status: DC | PRN

## 2020-01-31 NOTE — Telephone Encounter (Signed)
Tried calling pt back. Went to VM. Asked he call back. If he calls, please let him know MD sent in rx for hydrocodone

## 2020-01-31 NOTE — Telephone Encounter (Signed)
Called pt again. He did not receive message left. He would like to wait until f/u 02/21/20 with AL,NP to discuss options/referral to neurosurgery. He did receive some benefit from [facility]. Hip still bothering him. Back/leg pain better throughout the day but worse when he gets up in the am or when he gets home from work.  He is requesting refill on hydrocodone. Advised refill last sent 01/14/20 #30/30 days. He ran out 2 days ago and requesting refill early. I checked drug registry, he last refilled this 01/14/20 #30. Advised I will send message to MD and we will call back.

## 2020-01-31 NOTE — Telephone Encounter (Signed)
I sent the hydrocodone in.   We can always have him get a second ESi as a second one sometimes builds on the first.

## 2020-02-21 ENCOUNTER — Telehealth: Payer: Self-pay | Admitting: *Deleted

## 2020-02-21 ENCOUNTER — Ambulatory Visit: Payer: BC Managed Care – PPO | Admitting: Family Medicine

## 2020-02-21 NOTE — Telephone Encounter (Signed)
Called and spoke with wife, John Sullivan. Advised refill request too soon for his hydrocodone. He last refiled 01/31/20 #30. Should be able to fill on or after 03/01/20. Wife states script says q6 hours. Advised it also says as needed. It is not a scheduled medication and should be taken as needed. She states he ran out and has been taking her percocet Dr. Felecia Shelling also prescribes because he is having worsening back pain. Advised that he should not be taking her medication. She states she was not sure what else to do. She wants to know what Dr. Felecia Shelling recommends at this point. Did advise Dr. Felecia Shelling will be back on Monday. I will send message to him to review and will call back next week to advise. She verbalized understanding. Rescheduled appt to 03/14/19 at 930am and scheduled her f/u at 9am. She requested it be scheduled together.

## 2020-02-21 NOTE — Telephone Encounter (Signed)
Called and LMVM tcb r/s appt provider family illness. sy

## 2020-02-21 NOTE — Telephone Encounter (Signed)
McAllister,Francies(SGO on DPR) has called, she has rescheduled pt to see Dr Felecia Shelling and pt is on wait list, McAllister,Francies states pt is out of his HYDROcodone-acetaminophen (NORCO/VICODIN) 5-325 MG tablet    [id number] and she would like to know if any could be called in while pt waits to be seen, please call

## 2020-02-25 ENCOUNTER — Other Ambulatory Visit: Payer: Self-pay | Admitting: Neurology

## 2020-02-25 ENCOUNTER — Telehealth: Payer: Self-pay | Admitting: Neurology

## 2020-02-25 MED ORDER — HYDROCODONE-ACETAMINOPHEN 5-325 MG PO TABS: 1.0000 | ORAL_TABLET | Freq: Every day | ORAL | 0 refills | Status: DC | PRN

## 2020-02-25 NOTE — Telephone Encounter (Signed)
Called and spoke with pt. R/s for sooner appt tomorrow at 2pm with Dr. Felecia Shelling. cx appt in April. He will discuss request for [place] Duty letter at time of appt. He also reports he went to urgent care over the weekend. Dx with inguinal hernia on right side. Waiting to schedule for surgery. Wants refill for hydrocodone but aware refill too soon. He will speak with Dr. Felecia Shelling tomorrow about this.

## 2020-02-25 NOTE — Telephone Encounter (Signed)
Pt is needing a refill on his HYDROcodone-acetaminophen (NORCO/VICODIN) 5-325 MG tablet [facility] [address] Arlean Hopping

## 2020-02-25 NOTE — Telephone Encounter (Signed)
Pt called stating he has been summoned for jury duty and would like a CB in regards to possibly being excused from jury duty

## 2020-02-26 ENCOUNTER — Encounter: Payer: Self-pay | Admitting: Neurology

## 2020-02-26 ENCOUNTER — Telehealth: Payer: Self-pay | Admitting: *Deleted

## 2020-02-26 ENCOUNTER — Other Ambulatory Visit: Payer: Self-pay

## 2020-02-26 ENCOUNTER — Ambulatory Visit: Payer: BC Managed Care – PPO | Admitting: Neurology

## 2020-02-26 VITALS — BP 159/97 | HR 87 | Temp 97.6°F | Ht 67.0 in | Wt 131.5 lb

## 2020-02-26 DIAGNOSIS — M5416 Radiculopathy, lumbar region: Secondary | ICD-10-CM

## 2020-02-26 DIAGNOSIS — G35 Multiple sclerosis: Secondary | ICD-10-CM

## 2020-02-26 DIAGNOSIS — R208 Other disturbances of skin sensation: Secondary | ICD-10-CM | POA: Diagnosis not present

## 2020-02-26 DIAGNOSIS — M5431 Sciatica, right side: Secondary | ICD-10-CM

## 2020-02-26 DIAGNOSIS — R5383 Other fatigue: Secondary | ICD-10-CM

## 2020-02-26 MED ORDER — HYDROCODONE-ACETAMINOPHEN 7.5-325 MG PO TABS: 1.0000 | ORAL_TABLET | Freq: Two times a day (BID) | ORAL | 0 refills | Status: DC | PRN

## 2020-02-26 MED ORDER — HYDROCODONE-ACETAMINOPHEN 7.5-300 MG PO TABS: ORAL_TABLET | ORAL | 0 refills | Status: DC

## 2020-02-26 NOTE — Progress Notes (Signed)
[facility]  PATIENT: John Sullivan DOB: [birth year]  REFERRING DOCTOR OR PCP:  None SOURCE: patient and wife  _________________________________   HISTORICAL  CHIEF COMPLAINT:  Chief Complaint  Patient presents with  . Follow-up    RM 12 with wife. Last seen 10/24/2019.  . Lumbar radiculopathy    Takes hydrocodone, ran out this month early. In increased pain  . Multiple Sclerosis    On copaxone  . Dysesthesia    Takes lamotrigine/gabapentin  . Letter for School/Work    Requesting letter to excuse him from [place]:  John Sullivan is a 64 y.o. man with relapsing remitting MS.   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   Pain is in his lower back and radiates into the right leg.    He feels hte right leg is weaker.    He notes this climbing stairs.    He gets some spasms in the leg and back.   He notes numbness top of the right foot.    I reviewed his recent MRIs of the lumbar spine and brain in the presence of [name] and his wife. MRI lumbar 01/16/20: IMPRESSION: This MRI of the lumbar spine without contrast shows the following: 1.   At L3-L4, there is a right paramedian disc herniation superimposed on broad disc protrusion and left ligamenta flava hypertrophy causing severe right lateral recess stenosis and moderately severe left foraminal narrowing.  There is potential for right L4 and left L3 nerve root compression. 2.   At L4-L5, there is a right paramedian disc protrusion and other degenerative changes causing moderately severe right foraminal narrowing that could lead to right L4 nerve root compression. 3.   At L5-S1, there is right paramedian disc protrusion causing moderate right foraminal narrowing.  There is encroachment upon the right L5 nerve root but there does not appear to be compression. 4.   There are milder degenerative changes  at the other lumbar levels that do not lead to nerve root compression.  MRI Brain 01/16/2020 IMPRESSION: This MRI of the brain without contrast shows the following: 1.   There are multiple T2/FLAIR hyperintense foci in the hemispheres, cerebellum and thalamus in a pattern and configuration consistent with chronic demyelinating plaque associated with multiple sclerosis.  None of the foci appear to be acute.  Comparison films were not available.   2.   There were no acute findings.   Update 10/24/2019: MS is stable.   No exacerbations.   He has stable mild gait disturbance and urinary frequency.  He has not had any exacerbations since near the onset of his MS.  He is on glatiramer but does not take on a regular basis.  He is having more right leg pain and back pain.   Pain often acts up when he drives or right afterwards.    In the past, a trigger point injection helped for 2 months.  Another time a prednisone pack helped.    Hydrocodone helps.  He has insomnia.    Gabapentin helps the restless leg syndrome.   He uses MJ some nights which usually helps the insomnia.          Update 01/03/2019: He is on Copaxone and tolerates it well.    No exacerbations.   He denies any new neurologic symptoms form the MS but is much more fatigued.  He is also dozing off a lot more.   Gait is fine.   No stumbles.   Strength and sensation is fine.   At the last visit, he ws having piriformis pain and the injection has helped a lot.    He snores and John Sullivan has noted pauses in his breathing and some gasps, especially on his back.  He dozes off most days.     EPWORTH SLEEPINESS SCALE  On a scale of 0 - 3 what is the chance of dozing:  Sitting and Reading:   3 Watching TV:    3 Sitting inactive in a public place:  3 Passenger in car for one hour:  3 Lying down to rest in the afternoon: 3 Sitting and talking to someone:  1 Sitting quietly after lunch:  3 In a car, stopped in traffic:  0  Total (out of 24):    19/24   Severe daytime sleepiness   Update 08/30/2018: He feels his MS is stable.   He is on Copaxone and tolerates it well.   No exacerbations.  He is noting more cramps and spasms in his right leg, worse at night but occurring during the day as well.   He is noting mild weakness in his legs, right > left as well.   He is walking about the same.     The right hip is bothering him.     Lamotrigine with gabapentin has helped.        Bladder is doing ok on Oxybutynin.  Vision is doing well.   No diplopia.    His fatigue fluctuates and sometimes he feels very sleepy, even at work.    A nap helps him feel better quickly.   He also has RLS helped by ropinirole.      His sister has MS and has had more cognitive issues.  She also has had a stroke.     Update 04/27/2018: He reports more right leg pain.   He gets shooting radicular pain a few times a day from 90 seconds to 5 minutes.    Episodes are triggered by standing up from bed or while walking.  Pain is less likely to occur if walking and leaning forward.   Pain is like a 'cattle prod'.  Pain was very intermittent until 4-5 months ago.   His left leg occasionally has pain though it is usually milder.     He has not had any imaging.        His MS seems to be doing well.    He is on Copaxone qod and tolerates it well.    He notes mild issues with his gait and has no recent falls but some stumbles.    Lamotrigine and gabapentin has helped the dysesthetic pains.     Bladder frequency is better with oxybutynin.  Fatigue bothes him after work but not at work.   He feels the RLS has been better since adding ropinirole to gabapentin.          From 07/07/2017:  MS:   His MS is stable.   He feels stable on Copaxone every other day and tolerates it well.   His last exacerbation was around 2012 when he had diplopia that improved after IV steroid.    Gait/strength/sensation/ leg pain:  His gait and balance are mildly off, especially if he turns quickly.  There  continue to be some dysesthesias, especially in the right leg, helped by lamotrigine.   Pain  is usually worse later in the day.  He gets some benefit from the gabapentin .   He reports lower back pain also.    He only uses hydrocodone a few times a week and is out.Marland Kitchen   [place] databse reviewed, no filled scripts last 6 months.     Bladder:   He feels bladder has done better oxybutynin. There is no urgency.   Vision:  No diplopia or other visual changes.     He wears glasses.  Fatigue/Sleep/RLS:  He is back to work pretty much full time.    He notes mild fatigue, mostly in the late afternoons and evenings.  He is sleeping ok at night but RLS is worse since he stopped smoking MJ every night.       He snores but has never had apneic symptoms.    He denies sleepiness and does not nap.   He has restless leg syndrome with discomfort in the legs.  Ropinirole has helped but he takes irregularly only if RLS occurs.. Gabapentin helps a lot.   He no longer takes ropinirole..   Mood/Cognition:   He denies any significant depression or anxiety.    He has less irritability on lamotrigine.   He notes some short term memory loss and difficulty with naming/names.   Other:   He quit smoking MJ  MS History:   He was diagnosed with MS in 2001 after presenting with numbness in the hands and legs and vertigo.  He saw Dr. Nicole Kindred after symptoms persisted.   He had an MRI and LP, both consistent with MS.    He went on Rebif x 18 months but did not tolerate it well. He switched to see me around 2004.   MRI's have been stable on Copaxone.   His sister has MS, also  REVIEW OF SYSTEMS: Constitutional: No fevers, chills, sweats, or change in appetite.    Mild fatigue Eyes: No visual changes, double vision, eye pain Ear, nose and throat: No hearing loss, ear pain, nasal congestion, sore throat Cardiovascular: No chest pain, palpitations Respiratory: No shortness of breath at rest or with exertion.   No  wheezes GastrointestinaI: No nausea, vomiting, diarrhea, abdominal pain, fecal incontinence Genitourinary: No dysuria, urinary retention or frequency.  No nocturia. Musculoskeletal: No current neck pain, mils back pain Integumentary: No rash, pruritus, skin lesions Neurological: as above Psychiatric: No depression at this time.  No anxiety Endocrine: No palpitations, diaphoresis, change in appetite, change in weigh or increased thirst Hematologic/Lymphatic: No anemia, purpura, petechiae. Allergic/Immunologic: No itchy/runny eyes, nasal congestion, recent allergic reactions, rashes  ALLERGIES: No Known Allergies  HOME MEDICATIONS:  Current Outpatient Medications:  .  Ascorbic Acid (VITAMIN C PO), Take 1 Dose by mouth daily., Disp: , Rfl:  .  baclofen (LIORESAL) 10 MG tablet, Take 1 tablet (10 mg total) by mouth 3 (three) times daily., Disp: 270 tablet, Rfl: 0 .  cholecalciferol (VITAMIN D) 1000 UNITS tablet, Take 1,000 Units by mouth daily., Disp: , Rfl:  .  gabapentin (NEURONTIN) 800 MG tablet, Take 1 tablet (800 mg total) by mouth 3 (three) times daily., Disp: 270 tablet, Rfl: 1 .  glatiramer (COPAXONE) 20 MG/ML SOSY injection, Inject 1 mL (20 mg total) into the skin daily., Disp: 30 each, Rfl: 11 .  HYDROcodone-acetaminophen (NORCO) 7.5-325 MG tablet, Take 1 tablet by mouth 2 (two) times daily as needed for moderate pain., Disp: 60 tablet, Rfl: 0 .  lamoTRIgine (LAMICTAL) 100 MG tablet, TAKE 1 TABLET BY  MOUTH TWICE A DAY, Disp: 180 tablet, Rfl: 1 .  Multiple Vitamin (MULTIVITAMIN) capsule, Take 1 capsule by mouth daily., Disp: , Rfl:  .  oxybutynin (DITROPAN) 5 MG tablet, Take 1 tablet (5 mg total) by mouth 2 (two) times daily., Disp: 60 tablet, Rfl: 11 .  predniSONE (STERAPRED UNI-PAK 21 TAB) 10 MG (21) TBPK tablet, 6 pills po x 1 day, then 5 pills po x 1 day, then 4 pills po x 1 day, then 3 pills po x 1 day, then 2 pills po x 1 day, then 1 pill po x 1 day, Disp: 21 tablet, Rfl: 0 .   VITAMIN E PO, Take 1 Dose by mouth daily., Disp: , Rfl:   PAST MEDICAL HISTORY: Past Medical History:  Diagnosis Date  . Multiple sclerosis ([place])   . Neuropathy   . Vision abnormalities     PAST SURGICAL HISTORY: Past Surgical History:  Procedure Laterality Date  . TONSILLECTOMY AND ADENOIDECTOMY    . WISDOM TOOTH EXTRACTION      FAMILY HISTORY: Family History  Problem Relation Age of Onset  . AAA (abdominal aortic aneurysm) Mother   . Diabetes Father     SOCIAL HISTORY:  Social History   Socioeconomic History  . Marital status: Significant Other    Spouse name: Not on file  . Number of children: Not on file  . Years of education: Not on file  . Highest education level: Not on file  Occupational History  . Not on file  Tobacco Use  . Smoking status: Current Every Day Smoker    Packs/day: 0.50    Types: Cigarettes  . Smokeless tobacco: Never Used  Substance and Sexual Activity  . Alcohol use: Yes    Alcohol/week: 0.0 standard drinks    Comment: rare  . Drug use: No  . Sexual activity: Not on file  Other Topics Concern  . Not on file  Social History Narrative  . Not on file   Social Determinants of Health   Financial Resource Strain:   . Difficulty of Paying Living Expenses:   Food Insecurity:   . Worried About Charity fundraiser in the Last Year:   . Arboriculturist in the Last Year:   Transportation Needs:   . Film/video editor (Medical):   Marland Kitchen Lack of Transportation (Non-Medical):   Physical Activity:   . Days of Exercise per Week:   . Minutes of Exercise per Session:   Stress:   . Feeling of Stress :   Social Connections:   . Frequency of Communication with Friends and Family:   . Frequency of Social Gatherings with Friends and Family:   . Attends Religious Services:   . Active Member of Clubs or Organizations:   . Attends Archivist Meetings:   Marland Kitchen Marital Status:   Intimate Partner Violence:   . Fear of Current or Ex-Partner:    . Emotionally Abused:   Marland Kitchen Physically Abused:   . Sexually Abused:      PHYSICAL EXAM  Vitals:   02/26/20 1330  BP: (!) 159/97  Pulse: 87  Temp: 97.6 F (36.4 C)  Weight: 131 lb 8 oz (59.6 kg)  Height: 5\' 7"  (1.702 m)    Body mass index is 20.6 kg/m.   General: The patient is well-developed and well-nourished and in no acute distress  Musculoskeletal: .  The neck is nontender.  He has mild left greater than right piriformis muscle tenderness right now.  This is better than last visit.  Neurologic Exam  Mental status: The patient is alert and oriented x 3 at the time of the examination. The patient has apparent normal recent and remote memory, with an apparently normal attention span and concentration ability.   Speech is normal.  Cranial nerves: Extraocular movements are full. .Facial strength is normal.  Trapezius and sternocleidomastoid strength is normal. No dysarthria is noted.  The tongue is midline, and the patient has symmetric elevation of the soft palate. No obvious hearing deficits are noted but hearing slightly better on left.    Motor:  Muscle bulk is normal.  Muscle tone is slightly increased in the legs.  Strength is 5/5 in the arms and legs except 4+/5 right EHL muscle  Sensory: He has intact sensation to touch and vibration in the arms but reduced sensation to touch in the L5 distribution of the right leg  Coordination: Cerebellar testing reveals good finger-nose-finger and heel-to-shin bilaterally.  Gait and station: Station is normal.   Gait is arthritic.  Tandem gait is wide...  Romberg is negative.  Reflexes: Deep tendon reflexes are symmetric and normal bilaterally.          ASSESSMENT AND PLAN  Multiple sclerosis ([place])  Lumbar radiculopathy - Plan: Ambulatory referral to Neurosurgery  Right sided sciatica - Plan: Ambulatory referral to Neurosurgery  Dysesthesia  Other fatigue    1.   His MS has been stable for many years on Copaxone.   Recent MRI shows no progression.  We have discussed considering stopping the Copaxone and continue to follow MRIs to determine if he needs a disease modifying therapy.   2.   Low back pain and right lumbosacral radiculopathy pain is worse and he did not get much benefit from [facility].  I referred to neurosurgery for his right L4 and L5 radiculopathy.  I will increase the hydrocodone and we discussed that this is a temporary measure.   3.    Continue lamotrigine and gabapentin for dysesthesias and restless legs.  4.    Stay active and exercise as tolerated.  5.    Return in 4 months or sooner if there are new or worsening neurologic symptoms.  40-minute office visit with the majority of the time spent face-to-face for history and physical, discussion/counseling and decision-making.  Additional time with record review and documentation.    Neal Trulson A. Felecia Shelling, MD, PhD [id number], [id number] PM Certified in Neurology, Clinical Neurophysiology, Sleep Medicine, Pain Medicine and Neuroimaging  [facility] [address] [place], [place] [address] [phone]

## 2020-02-26 NOTE — Telephone Encounter (Signed)
[id number] [place], [place] [address] AT [place] that they do not stock hydrocodone-acetaminophen 7.5-300mg  Wanting to know if they can switch to 7.5-325mg  instead?

## 2020-03-13 ENCOUNTER — Ambulatory Visit: Payer: BC Managed Care – PPO | Admitting: Neurology

## 2020-03-13 ENCOUNTER — Ambulatory Visit: Payer: Self-pay | Admitting: Neurology

## 2020-04-15 ENCOUNTER — Other Ambulatory Visit: Payer: Self-pay | Admitting: Neurology

## 2020-04-15 MED ORDER — HYDROCODONE-ACETAMINOPHEN 7.5-325 MG PO TABS: 1.0000 | ORAL_TABLET | Freq: Two times a day (BID) | ORAL | 0 refills | Status: DC | PRN

## 2020-04-15 NOTE — Addendum Note (Signed)
Addended by: Britt Bottom on: 04/15/2020 04:49 PM   Modules accepted: Orders

## 2020-04-15 NOTE — Telephone Encounter (Addendum)
I reviewed Dr. Garth Bigness last [place] note from 02/26/20: "Low back pain and right lumbosacral radiculopathy pain is worse and he did not get much benefit from [facility].  I referred to neurosurgery for his right L4 and L5 radiculopathy.  I will increase the hydrocodone and we discussed that this is a temporary measure." Checked drug registry. He last refilled 02/26/20 #60. Last seen 02/26/20 and next f/u 07/03/20  I called pt back to further discuss. He has a hernia now as well. Had surgery scheduled but when he went for pre-op his lab work found elevated WBC and had to cx surgery. He also had back surgery scheduled but cx that as well. He got further evaluated and was diagnosed with chronic leukemia type A. They will continue to follow him for this. Dr. Harlow Asa at the cancer center in [facility], [place] is who he follows.  He just stopped by Dr. Tawanna Cooler office who was going to do surgery for hernia to get disability renewed. He has also now been cleared to go through back surgery and waiting on call to get this scheduled. His wife also has to have surgery and they are waiting to get scheduled.  He is wanting Dr. Felecia Shelling to refill hydrocodone for him.

## 2020-04-15 NOTE — Telephone Encounter (Signed)
Pt has called for a refill on his HYDROcodone-acetaminophen (NORCO) 7.5-325 MG tablet [id number] .  Pt is asking for a call from RN to discuss updates he wants to go over with her.

## 2020-04-16 NOTE — Telephone Encounter (Signed)
Called pt back to let him know Dr. Felecia Shelling called in hydrocodone for him. He verbalized understanding. He is going to get hernia surgery first before getting the back surgery-FYI.

## 2020-05-06 HISTORY — PX: INGUINAL HERNIA REPAIR: SUR1180

## 2020-05-07 ENCOUNTER — Other Ambulatory Visit: Payer: Self-pay | Admitting: Neurology

## 2020-05-12 ENCOUNTER — Other Ambulatory Visit: Payer: Self-pay | Admitting: Neurosurgery

## 2020-05-22 ENCOUNTER — Other Ambulatory Visit: Payer: Self-pay | Admitting: *Deleted

## 2020-05-22 ENCOUNTER — Encounter: Payer: Self-pay | Admitting: *Deleted

## 2020-05-22 MED ORDER — HYDROCODONE-ACETAMINOPHEN 7.5-325 MG PO TABS: 1.0000 | ORAL_TABLET | Freq: Two times a day (BID) | ORAL | 0 refills | Status: DC | PRN

## 2020-05-22 NOTE — Telephone Encounter (Signed)
Received call from patient who needs [facility]ill on [facility]cod[facility]

## 2020-05-22 NOTE — Addendum Note (Signed)
Addended by: Verlin Grills on: 05/22/2020 02:17 PM   Modules accepted: Orders

## 2020-05-26 ENCOUNTER — Telehealth: Payer: Self-pay | Admitting: Neurology

## 2020-05-26 NOTE — Telephone Encounter (Signed)
I completed PA for hydrocodone via covermymeds.  Key: [id number].  Request was approved but no dates given. Pharmacy notified.  I called pt and advised him of this. [facility]co.

## 2020-05-26 NOTE — Telephone Encounter (Signed)
Pt called stating that his insurance and pharmacy are needing to know why the pt is still needing the HYDROcodone-acetaminophen ([place]) 7.5-325 MG tablet Please advise.

## 2020-05-26 NOTE — Telephone Encounter (Signed)
[facility] need a [facility] for the hydrocodone. They are faxing the request to [place] now.

## 2020-05-30 NOTE — Progress Notes (Signed)
[id number] [place],  - [address], [address] AT [facility] [address] [place], [address] [place] [address] Phone: [phone] Fax: [phone]      Your procedure is scheduled on Wednesday, June 30th.  Report to [facility] Main Entrance "A" at 7:00 A.M., and check in at the Admitting office.  Call this number if you have problems the morning of surgery:  [phone]  Call [phone] if you have any questions prior to your surgery date Monday-Friday 8am-4pm    Remember:  Do not eat or drink after midnight the night before your surgery     Take these medicines the morning of surgery with A SIP OF WATER   Colace  Gabapentin (Neurontin)  Hydrocodone-Acetaminophen (Norco) - if needed  Lamotrigine (Lamictal)    As of today, STOP taking any Aspirin (unless otherwise instructed by your surgeon) and Aspirin containing products, Aleve, Naproxen, Ibuprofen, Motrin, Advil, Goody's, BC's, all herbal medications, fish oil, and all vitamins.                      Do not wear jewelry            Do not wear lotions, powders, colognes, or deodorant.            Men may shave face and neck.            Do not bring valuables to the hospital.            [facility] is not responsible for any belongings or valuables.  Do NOT Smoke (Tobacco/Vapping) or drink Alcohol 24 hours prior to your procedure If you use a CPAP at night, you may bring all equipment for your overnight stay.   Contacts, glasses, dentures or bridgework may not be worn into surgery.      For patients admitted to the hospital, discharge time will be determined by your treatment team.   Patients discharged the day of surgery will not be allowed to drive home, and someone needs to stay with them for 24 hours.    Special instructions:   [place]- Preparing For Surgery  Before surgery, you can play an important role. Because skin is not sterile, your skin needs to be as  free of germs as possible. You can reduce the number of germs on your skin by washing with CHG (chlorahexidine gluconate) Soap before surgery.  CHG is an antiseptic cleaner which kills germs and bonds with the skin to continue killing germs even after washing.    Oral Hygiene is also important to reduce your risk of infection.  Remember - BRUSH YOUR TEETH THE MORNING OF SURGERY WITH YOUR REGULAR TOOTHPASTE  Please do not use if you have an allergy to CHG or antibacterial soaps. If your skin becomes reddened/irritated stop using the CHG.  Do not shave (including legs and underarms) for at least 48 hours prior to first CHG shower. It is OK to shave your face.  Please follow these instructions carefully.   1. Shower the NIGHT BEFORE SURGERY and the MORNING OF SURGERY with CHG Soap.   2. If you chose to wash your hair, wash your hair first as usual with your normal shampoo.  3. After you shampoo, rinse your hair and body thoroughly to remove the shampoo.  4. Use CHG as you would any other liquid soap. You can apply CHG directly to the skin and wash gently with a scrungie or a  clean washcloth.   5. Apply the CHG Soap to your body ONLY FROM THE NECK DOWN.  Do not use on open wounds or open sores. Avoid contact with your eyes, ears, mouth and genitals (private parts). Wash Face and genitals (private parts)  with your normal soap.   6. Wash thoroughly, paying special attention to the area where your surgery will be performed.  7. Thoroughly rinse your body with warm water from the neck down.  8. DO NOT shower/wash with your normal soap after using and rinsing off the CHG Soap.  9. Pat yourself dry with a CLEAN TOWEL.  10. Wear CLEAN PAJAMAS to bed the night before surgery, wear comfortable clothes the morning of surgery  11. Place CLEAN SHEETS on your bed the night of your first shower and DO NOT SLEEP WITH PETS.   Day of Surgery:   Do not apply any deodorants/lotions.  Please wear clean  clothes to the hospital/surgery center.   Remember to brush your teeth WITH YOUR REGULAR TOOTHPASTE.   Please read over the following fact sheets that you were given.

## 2020-06-02 ENCOUNTER — Other Ambulatory Visit (HOSPITAL_COMMUNITY)
Admission: RE | Admit: 2020-06-02 | Discharge: 2020-06-02 | Disposition: A | Discharge status: Home / Self Care | Payer: BC Managed Care – PPO | Source: Ambulatory Visit | Attending: Neurosurgery | Admitting: Neurosurgery

## 2020-06-02 ENCOUNTER — Other Ambulatory Visit: Payer: Self-pay

## 2020-06-02 ENCOUNTER — Encounter (HOSPITAL_COMMUNITY): Payer: Self-pay

## 2020-06-02 ENCOUNTER — Encounter (HOSPITAL_COMMUNITY)
Admission: RE | Admit: 2020-06-02 | Discharge: 2020-06-02 | Disposition: A | Discharge status: Home / Self Care | Payer: BC Managed Care – PPO | Source: Ambulatory Visit | Attending: Neurosurgery | Admitting: Neurosurgery

## 2020-06-02 DIAGNOSIS — Z01812 Encounter for preprocedural laboratory examination: Secondary | ICD-10-CM | POA: Insufficient documentation

## 2020-06-02 HISTORY — DX: Chronic obstructive pulmonary disease, unspecified: J44.9

## 2020-06-02 HISTORY — DX: Malignant (primary) neoplasm, unspecified: C80.1

## 2020-06-02 HISTORY — DX: Essential (primary) hypertension: I10

## 2020-06-02 LAB — COMPREHENSIVE METABOLIC PANEL
ALT: 17 U/L (ref 0–44)
AST: 26 U/L (ref 15–41)
Albumin: 4.1 g/dL (ref 3.5–5.0)
Alkaline Phosphatase: 98 U/L (ref 38–126)
Anion gap: 8 (ref 5–15)
BUN: 19 mg/dL (ref 8–23)
CO2: 27 mmol/L (ref 22–32)
Calcium: 9.4 mg/dL (ref 8.9–10.3)
Chloride: 105 mmol/L (ref 98–111)
Creatinine, Ser: 1.1 mg/dL (ref 0.61–1.24)
GFR calc Af Amer: >60 mL/min (ref >60)
GFR calc non Af Amer: >60 mL/min (ref >60)
Glucose, Bld: 87 mg/dL (ref 70–99)
Potassium: 5.4 mmol/L — ABNORMAL HIGH (ref 3.5–5.1)
Sodium: 140 mmol/L (ref 135–145)
Total Bilirubin: 0.7 mg/dL (ref 0.3–1.2)
Total Protein: 6.5 g/dL (ref 6.5–8.1)

## 2020-06-02 LAB — CBC
HCT: 43.9 % (ref 39.0–52.0)
Hemoglobin: 14.3 g/dL (ref 13.0–17.0)
MCH: 32.1 pg (ref 26.0–34.0)
MCHC: 32.6 g/dL (ref 30.0–36.0)
MCV: 98.4 fL (ref 80.0–100.0)
Platelets: 278 10*3/uL (ref 150–400)
RBC: 4.46 MIL/uL (ref 4.22–5.81)
RDW: 12.6 % (ref 11.5–15.5)
WBC: 42.5 10*3/uL — ABNORMAL HIGH (ref 4.0–10.5)
nRBC: 0 % (ref 0.0–0.2)

## 2020-06-02 LAB — SURGICAL PCR SCREEN
MRSA, PCR: NEGATIVE
Staphylococcus aureus: NEGATIVE

## 2020-06-02 LAB — SARS CORONAVIRUS 2 (TAT 6-24 HRS): SARS Coronavirus 2: NEGATIVE

## 2020-06-02 NOTE — Progress Notes (Signed)
[id number] - [place], [place] - [address], [address] AT [facility] [address], [address] [place] [address] Phone: [phone] Fax: [phone]      Your procedure is scheduled on Wednesday, June 30th.  Report to [facility] Main Entrance "A" at 7:00 A.M., and check in at the Admitting office.  Call this number if you have problems the morning of surgery:  [phone]  Call [phone] if you have any questions prior to your surgery date Monday-Friday 8am-4pm    Remember:  Do not eat or drink after midnight the night before your surgery     Take these medicines the morning of surgery with A SIP OF WATER   baclofen (LIORESAL)   Gabapentin (Neurontin)  Hydrocodone-Acetaminophen (Norco) - if needed  Lamotrigine (Lamictal)  oxybutynin (DITROPAN)  As of today, STOP taking any Aspirin (unless otherwise instructed by your surgeon) and Aspirin containing products, Aleve, Naproxen, Ibuprofen, Motrin, Advil, Goody's, BC's, all herbal medications, fish oil, and all vitamins.             Do not wear jewelry            Do not wear lotions, powders, colognes, or deodorant.            Men may shave face and neck.            Do not bring valuables to the hospital.            [facility] is not responsible for any belongings or valuables.  Do NOT Smoke (Tobacco/Vapping) or drink Alcohol 24 hours prior to your procedure If you use a CPAP at night, you may bring all equipment for your overnight stay.   Contacts, glasses, dentures or bridgework may not be worn into surgery.      For patients admitted to the hospital, discharge time will be determined by your treatment team.   Patients discharged the day of surgery will not be allowed to drive home, and someone needs to stay with them for 24 hours.    Special instructions:   [facility]- Preparing For Surgery  Before surgery, you can play an important role. Because skin is not sterile,  your skin needs to be as free of germs as possible. You can reduce the number of germs on your skin by washing with CHG (chlorahexidine gluconate) Soap before surgery.  CHG is an antiseptic cleaner which kills germs and bonds with the skin to continue killing germs even after washing.    Oral Hygiene is also important to reduce your risk of infection.  Remember - BRUSH YOUR TEETH THE MORNING OF SURGERY WITH YOUR REGULAR TOOTHPASTE  Please do not use if you have an allergy to CHG or antibacterial soaps. If your skin becomes reddened/irritated stop using the CHG.  Do not shave (including legs and underarms) for at least 48 hours prior to first CHG shower. It is OK to shave your face.  Please follow these instructions carefully.   1. Shower the NIGHT BEFORE SURGERY and the MORNING OF SURGERY with CHG Soap.   2. If you chose to wash your hair, wash your hair first as usual with your normal shampoo.  3. After you shampoo, rinse your hair and body thoroughly to remove the shampoo.  4. Use CHG as you would any other liquid soap. You can apply CHG directly to the skin and wash gently with a scrungie or a clean washcloth.   5. Apply  the CHG Soap to your body ONLY FROM THE NECK DOWN.  Do not use on open wounds or open sores. Avoid contact with your eyes, ears, mouth and genitals (private parts). Wash Face and genitals (private parts)  with your normal soap.   6. Wash thoroughly, paying special attention to the area where your surgery will be performed.  7. Thoroughly rinse your body with warm water from the neck down.  8. DO NOT shower/wash with your normal soap after using and rinsing off the CHG Soap.  9. Pat yourself dry with a CLEAN TOWEL.  10. Wear CLEAN PAJAMAS to bed the night before surgery, wear comfortable clothes the morning of surgery  11. Place CLEAN SHEETS on your bed the night of your first shower and DO NOT SLEEP WITH PETS.   Day of Surgery:   Do not apply any  deodorants/lotions.  Please wear clean clothes to the hospital/surgery center.   Remember to brush your teeth WITH YOUR REGULAR TOOTHPASTE.   Please read over the following fact sheets that you were given.

## 2020-06-02 NOTE — Progress Notes (Signed)
Dr. Lacy Duverney office notified of pt's elevated WBC and Potassium.   Jacqlyn Larsen, RN

## 2020-06-02 NOTE — Progress Notes (Signed)
PCP - denies Cardiologist -denies Oncologist-Dr. Jacqulyn Ducking   PPM/ICD - N/A Device Orders -N/A  Rep Notified - N/A  Chest x-ray - N/A EKG - requested tracing from [facility]. 03/13/20 Stress Test - denies ECHO - denies Cardiac Cath - denies  Sleep Study - denies CPAP - denies  Blood Thinner Instructions: N/A Aspirin Instructions:N/A  ERAS Protcol -N/A PRE-SURGERY Ensure or G2- N/A  COVID TEST- 06/02/20. Pt educated on quarantine requirements.    Anesthesia review: Yes, recently diagonosed with CLL. Pt follows up with an Oncologist.   Patient denies shortness of breath, fever, cough and chest pain at PAT appointment   All instructions explained to the patient, with a verbal understanding of the material. Patient agrees to go over the instructions while at home for a better understanding. Patient also instructed to self quarantine after being tested for COVID-19. The opportunity to ask questions was provided.    Coronavirus Screening  Have you experienced the following symptoms:  Cough yes/no: No Fever (>100.64F)  yes/no: No Runny nose yes/no: No Sore throat yes/no: No Difficulty breathing/shortness of breath  yes/no: No  Have you or a family member traveled in the last 14 days and where? yes/no: No   If the patient indicates "YES" to the above questions, their PAT will be rescheduled to limit the exposure to others and, the surgeon will be notified. THE PATIENT WILL NEED TO BE ASYMPTOMATIC FOR 14 DAYS.   If the patient is not experiencing any of these symptoms, the PAT nurse will instruct them to NOT bring anyone with them to their appointment since they may have these symptoms or traveled as well.   Please remind your patients and families that hospital visitation restrictions are in effect and the importance of the restrictions.

## 2020-06-03 NOTE — Progress Notes (Addendum)
Anesthesia Chart Review:  Recent diagnosis of CLL. Patient noted to have abnormal labs on preop visit for hernia surgery at [facility]. He was referred to oncology and seen by Dr. Harlow Asa. Per notes, he does not currently require any treatment for this. CT CAP on 04/04/20 did not reveal any signs of advanced CLL. He was subsequently cleared to undergo inguinal hernia repair which he had 05/23/20 without issue.  History of relapsing/remitting MS, followed by neurology Dr. Felecia Shelling. Per last note 02/26/20 his MS has been stable for many years on Copaxone, recent MRI with no progression. He is on lamotrigine and gabapentin for dysesthesias and restless legs. Discussed worsening LBP and pt was referred to neurosurgery for further management.  Preop labs reviewed. Potassium mildly elevated 5.4. WBC elevated 42.5 c/w hx of CLL.  EKG 03/13/20 (copy on chart): Sinus rhythm. Rate 89.  CT C/A/P 04/04/20 (care everywhere): IMPRESSION: 1. No lymphadenopathy the or other findings in the chest, abdomen or pelvis to suggest recurrent disease in this patient with history of CLL. 2. No acute findings are noted in the chest, abdomen or pelvis. 3. Aortic atherosclerosis, in addition to 3 vessel coronary artery disease. Please note that although the presence of coronary artery calcium documents the presence of coronary artery disease, the severity of this disease and any potential stenosis cannot be assessed on this non-gated CT examination. Assessment for potential risk factor modification, dietary therapy or pharmacologic therapy may be warranted, if clinically indicated. 4. Diffuse bronchial wall thickening with mild to moderate centrilobular and paraseptal emphysema; imaging findings suggestive of underlying COPD. 5. Additional incidental findings, as above.  Wynonia Musty [facility] Short Stay Center/Anesthesiology Phone [phone] 06/03/2020 9:53 AM

## 2020-06-03 NOTE — Anesthesia Preprocedure Evaluation (Addendum)
Anesthesia Evaluation  Patient identified by MRN, date of birth, ID band Patient awake    Reviewed: Allergy & Precautions, H&P , NPO status , Patient's Chart, lab work & pertinent test results  Airway Mallampati: II  TM Distance: >3 FB Neck ROM: Full    Dental no notable dental hx. (+) Edentulous Upper, Edentulous Lower, Dental Advisory Given   Pulmonary COPD, Current Smoker,    Pulmonary exam normal breath sounds clear to auscultation       Cardiovascular Exercise Tolerance: Good hypertension,  Rhythm:Regular Rate:Normal     Neuro/Psych  Neuromuscular disease negative psych ROS   GI/Hepatic negative GI ROS, Neg liver ROS,   Endo/Other  negative endocrine ROS  Renal/GU negative Renal ROS  negative genitourinary   Musculoskeletal   Abdominal   Peds  Hematology negative hematology ROS (+)   Anesthesia Other Findings   Reproductive/Obstetrics negative OB ROS                           Anesthesia Physical Anesthesia Plan  ASA: II  Anesthesia Plan: General   Post-op Pain Management:    Induction: Intravenous  PONV Risk Score and Plan: 2 and Ondansetron, Dexamethasone and Midazolam  Airway Management Planned: Oral ETT  Additional Equipment:   Intra-op Plan:   Post-operative Plan: Extubation in OR  Informed Consent: I have reviewed the patients History and Physical, chart, labs and discussed the procedure including the risks, benefits and alternatives for the proposed anesthesia with the patient or authorized representative who has indicated his/her understanding and acceptance.       Plan Discussed with: CRNA and Surgeon  Anesthesia Plan Comments: (PAT note by Karoline Caldwell, PA-C: Recent diagnosis of CLL. Patient noted to have abnormal labs on preop visit for hernia surgery at [facility]. He was referred to oncology and seen by Dr. Harlow Asa. Per notes, he does not currently require any  treatment for this. CT CAP on 04/04/20 did not reveal any signs of advanced CLL. He was subsequently cleared to undergo inguinal hernia repair which he had 05/23/20 without issue.  History of relapsing/remitting MS, followed by neurology Dr. Felecia Shelling. Per last note 02/26/20 his MS has been stable for many years on Copaxone, recent MRI with no progression. He is on lamotrigine and gabapentin for dysesthesias and restless legs. Discussed worsening LBP and pt was referred to neurosurgery for further management.  Preop labs reviewed. Potassium mildly elevated 5.4. WBC elevated 42.5 c/w hx of CLL.  EKG 03/13/20 (copy on chart): Sinus rhythm. Rate 89.  CT C/A/P 04/04/20 (care everywhere): IMPRESSION: 1. No lymphadenopathy the or other findings in the chest, abdomen or pelvis to suggest recurrent disease in this patient with history of CLL. 2. No acute findings are noted in the chest, abdomen or pelvis. 3. Aortic atherosclerosis, in addition to 3 vessel coronary artery disease. Please note that although the presence of coronary artery calcium documents the presence of coronary artery disease, the severity of this disease and any potential stenosis cannot be assessed on this non-gated CT examination. Assessment for potential risk factor modification, dietary therapy or pharmacologic therapy may be warranted, if clinically indicated. 4. Diffuse bronchial wall thickening with mild to moderate centrilobular and paraseptal emphysema; imaging findings suggestive of underlying COPD. 5. Additional incidental findings, as above.  )     Anesthesia Quick Evaluation

## 2020-06-04 ENCOUNTER — Other Ambulatory Visit: Payer: Self-pay

## 2020-06-04 ENCOUNTER — Ambulatory Visit (HOSPITAL_COMMUNITY): Payer: BC Managed Care – PPO | Admitting: Physician Assistant

## 2020-06-04 ENCOUNTER — Encounter (HOSPITAL_COMMUNITY)
Admission: AD | Disposition: A | Discharge status: Home / Self Care | Payer: Self-pay | Source: Home / Self Care | Attending: Neurosurgery

## 2020-06-04 ENCOUNTER — Ambulatory Visit (HOSPITAL_COMMUNITY): Payer: BC Managed Care – PPO | Admitting: Anesthesiology

## 2020-06-04 ENCOUNTER — Encounter (HOSPITAL_COMMUNITY): Payer: Self-pay | Admitting: Neurosurgery

## 2020-06-04 ENCOUNTER — Ambulatory Visit (HOSPITAL_COMMUNITY): Payer: BC Managed Care – PPO

## 2020-06-04 ENCOUNTER — Inpatient Hospital Stay (HOSPITAL_COMMUNITY)
Admission: AD | Admit: 2020-06-04 | Discharge: 2020-06-06 | DRG: 460 | Disposition: A | Discharge status: Home / Self Care | Payer: BC Managed Care – PPO | Attending: Neurosurgery | Admitting: Neurosurgery

## 2020-06-04 DIAGNOSIS — M48062 Spinal stenosis, lumbar region with neurogenic claudication: Principal | ICD-10-CM | POA: Diagnosis present

## 2020-06-04 DIAGNOSIS — M5126 Other intervertebral disc displacement, lumbar region: Secondary | ICD-10-CM | POA: Diagnosis present

## 2020-06-04 DIAGNOSIS — R159 Full incontinence of feces: Secondary | ICD-10-CM | POA: Diagnosis present

## 2020-06-04 DIAGNOSIS — G35 Multiple sclerosis: Secondary | ICD-10-CM | POA: Diagnosis present

## 2020-06-04 DIAGNOSIS — Z419 Encounter for procedure for purposes other than remedying health state, unspecified: Secondary | ICD-10-CM

## 2020-06-04 DIAGNOSIS — F17210 Nicotine dependence, cigarettes, uncomplicated: Secondary | ICD-10-CM | POA: Diagnosis present

## 2020-06-04 DIAGNOSIS — R32 Unspecified urinary incontinence: Secondary | ICD-10-CM | POA: Diagnosis present

## 2020-06-04 DIAGNOSIS — Z20822 Contact with and (suspected) exposure to covid-19: Secondary | ICD-10-CM | POA: Diagnosis present

## 2020-06-04 DIAGNOSIS — I10 Essential (primary) hypertension: Secondary | ICD-10-CM | POA: Diagnosis present

## 2020-06-04 SURGERY — POSTERIOR LUMBAR FUSION 1 LEVEL
Anesthesia: General | Site: Back | Laterality: Bilateral

## 2020-06-04 MED ORDER — DIAZEPAM 5 MG PO TABS
5.0000 mg | ORAL_TABLET | Freq: Four times a day (QID) | ORAL | Status: DC | PRN
  Administered 2020-06-04 – 2020-06-05 (×3): 5 mg via ORAL
  Filled 2020-06-04 (×2): qty 1

## 2020-06-04 MED ORDER — SODIUM CHLORIDE 0.9% FLUSH: 3.0000 mL | INTRAVENOUS | Status: DC | PRN

## 2020-06-04 MED ORDER — DEXAMETHASONE SODIUM PHOSPHATE 10 MG/ML IJ SOLN
INTRAMUSCULAR | Status: AC
  Filled 2020-06-04: qty 1

## 2020-06-04 MED ORDER — PHENYLEPHRINE HCL-NACL 10-0.9 MG/250ML-% IV SOLN
INTRAVENOUS | Status: DC | PRN
  Administered 2020-06-04: 25 ug/min via INTRAVENOUS

## 2020-06-04 MED ORDER — DOCUSATE SODIUM 100 MG PO CAPS
100.0000 mg | ORAL_CAPSULE | Freq: Every day | ORAL | Status: DC | PRN
  Administered 2020-06-05: 100 mg via ORAL
  Filled 2020-06-04: qty 1

## 2020-06-04 MED ORDER — LIDOCAINE-EPINEPHRINE 0.5 %-1:200000 IJ SOLN
INTRAMUSCULAR | Status: AC
  Filled 2020-06-04: qty 1

## 2020-06-04 MED ORDER — HEMOSTATIC AGENTS (NO CHARGE) OPTIME
TOPICAL | Status: DC | PRN
  Administered 2020-06-04: 1 via TOPICAL

## 2020-06-04 MED ORDER — BISACODYL 5 MG PO TBEC
5.0000 mg | DELAYED_RELEASE_TABLET | Freq: Every day | ORAL | Status: DC | PRN
  Administered 2020-06-05: 5 mg via ORAL
  Filled 2020-06-04: qty 1

## 2020-06-04 MED ORDER — OXYBUTYNIN CHLORIDE 5 MG PO TABS
5.0000 mg | ORAL_TABLET | Freq: Two times a day (BID) | ORAL | Status: DC
  Administered 2020-06-04 – 2020-06-06 (×4): 5 mg via ORAL
  Filled 2020-06-04 (×5): qty 1

## 2020-06-04 MED ORDER — HYDROMORPHONE HCL 1 MG/ML IJ SOLN
0.2500 mg | INTRAMUSCULAR | Status: DC | PRN
  Administered 2020-06-04 (×2): 0.5 mg via INTRAVENOUS

## 2020-06-04 MED ORDER — CHLORHEXIDINE GLUCONATE CLOTH 2 % EX PADS: 6.0000 | MEDICATED_PAD | Freq: Once | CUTANEOUS | Status: DC

## 2020-06-04 MED ORDER — ROCURONIUM BROMIDE 10 MG/ML (PF) SYRINGE
PREFILLED_SYRINGE | INTRAVENOUS | Status: DC | PRN
  Administered 2020-06-04: 60 mg via INTRAVENOUS

## 2020-06-04 MED ORDER — FENTANYL CITRATE (PF) 250 MCG/5ML IJ SOLN
INTRAMUSCULAR | Status: AC
  Filled 2020-06-04: qty 5

## 2020-06-04 MED ORDER — ACETAMINOPHEN 650 MG RE SUPP: 650.0000 mg | RECTAL | Status: DC | PRN

## 2020-06-04 MED ORDER — OXYCODONE HCL 5 MG PO TABS
10.0000 mg | ORAL_TABLET | ORAL | Status: DC | PRN
  Administered 2020-06-04 – 2020-06-06 (×10): 10 mg via ORAL
  Filled 2020-06-04 (×10): qty 2

## 2020-06-04 MED ORDER — NICOTINE 21 MG/24HR TD PT24
21.0000 mg | MEDICATED_PATCH | Freq: Every day | TRANSDERMAL | Status: DC
  Administered 2020-06-04 – 2020-06-06 (×3): 21 mg via TRANSDERMAL
  Filled 2020-06-04 (×3): qty 1

## 2020-06-04 MED ORDER — THROMBIN 5000 UNITS EX SOLR
CUTANEOUS | Status: AC
  Filled 2020-06-04: qty 10000

## 2020-06-04 MED ORDER — ROCURONIUM BROMIDE 10 MG/ML (PF) SYRINGE
PREFILLED_SYRINGE | INTRAVENOUS | Status: AC
  Filled 2020-06-04: qty 10

## 2020-06-04 MED ORDER — PROPOFOL 10 MG/ML IV BOLUS
INTRAVENOUS | Status: AC
  Filled 2020-06-04: qty 20

## 2020-06-04 MED ORDER — OXYCODONE HCL 5 MG PO TABS: 5.0000 mg | ORAL_TABLET | ORAL | Status: DC | PRN

## 2020-06-04 MED ORDER — LIDOCAINE 2% (20 MG/ML) 5 ML SYRINGE
INTRAMUSCULAR | Status: AC
  Filled 2020-06-04: qty 5

## 2020-06-04 MED ORDER — BUPIVACAINE HCL (PF) 0.5 % IJ SOLN
INTRAMUSCULAR | Status: AC
  Filled 2020-06-04: qty 30

## 2020-06-04 MED ORDER — MORPHINE SULFATE (PF) 2 MG/ML IV SOLN
2.0000 mg | INTRAVENOUS | Status: DC | PRN
  Administered 2020-06-04: 2 mg via INTRAVENOUS
  Filled 2020-06-04: qty 1

## 2020-06-04 MED ORDER — CEFAZOLIN SODIUM-DEXTROSE 2-4 GM/100ML-% IV SOLN
2.0000 g | INTRAVENOUS | Status: AC
  Administered 2020-06-04: 2 g via INTRAVENOUS
  Filled 2020-06-04: qty 100

## 2020-06-04 MED ORDER — LIDOCAINE 2% (20 MG/ML) 5 ML SYRINGE
INTRAMUSCULAR | Status: DC | PRN
  Administered 2020-06-04: 40 mg via INTRAVENOUS

## 2020-06-04 MED ORDER — LAMOTRIGINE 100 MG PO TABS
100.0000 mg | ORAL_TABLET | Freq: Two times a day (BID) | ORAL | Status: DC
  Administered 2020-06-04 – 2020-06-06 (×4): 100 mg via ORAL
  Filled 2020-06-04 (×5): qty 1

## 2020-06-04 MED ORDER — ONDANSETRON HCL 4 MG/2ML IJ SOLN: 4.0000 mg | Freq: Four times a day (QID) | INTRAMUSCULAR | Status: DC | PRN

## 2020-06-04 MED ORDER — ONDANSETRON HCL 4 MG/2ML IJ SOLN
INTRAMUSCULAR | Status: AC
  Filled 2020-06-04: qty 2

## 2020-06-04 MED ORDER — DIAZEPAM 5 MG PO TABS
ORAL_TABLET | ORAL | Status: AC
  Filled 2020-06-04: qty 1

## 2020-06-04 MED ORDER — SODIUM CHLORIDE 0.9% FLUSH
3.0000 mL | Freq: Two times a day (BID) | INTRAVENOUS | Status: DC
  Administered 2020-06-04: 3 mL via INTRAVENOUS

## 2020-06-04 MED ORDER — SUGAMMADEX SODIUM 200 MG/2ML IV SOLN
INTRAVENOUS | Status: DC | PRN
  Administered 2020-06-04: 200 mg via INTRAVENOUS

## 2020-06-04 MED ORDER — SODIUM CHLORIDE 0.9 % IV SOLN: 250.0000 mL | INTRAVENOUS | Status: DC

## 2020-06-04 MED ORDER — 0.9 % SODIUM CHLORIDE (POUR BTL) OPTIME
TOPICAL | Status: DC | PRN
  Administered 2020-06-04: 1000 mL

## 2020-06-04 MED ORDER — SENNOSIDES-DOCUSATE SODIUM 8.6-50 MG PO TABS: 1.0000 | ORAL_TABLET | Freq: Every evening | ORAL | Status: DC | PRN

## 2020-06-04 MED ORDER — ACETAMINOPHEN 500 MG PO TABS
500.0000 mg | ORAL_TABLET | Freq: Once | ORAL | Status: AC
  Administered 2020-06-04: 500 mg via ORAL

## 2020-06-04 MED ORDER — DOCUSATE SODIUM 100 MG PO CAPS
100.0000 mg | ORAL_CAPSULE | Freq: Two times a day (BID) | ORAL | Status: DC
  Administered 2020-06-04 – 2020-06-06 (×4): 100 mg via ORAL
  Filled 2020-06-04 (×4): qty 1

## 2020-06-04 MED ORDER — HYDROMORPHONE HCL 1 MG/ML IJ SOLN
INTRAMUSCULAR | Status: AC
  Filled 2020-06-04: qty 1

## 2020-06-04 MED ORDER — CHLORHEXIDINE GLUCONATE 0.12 % MT SOLN
15.0000 mL | Freq: Once | OROMUCOSAL | Status: AC
  Administered 2020-06-04: 15 mL via OROMUCOSAL
  Filled 2020-06-04: qty 15

## 2020-06-04 MED ORDER — PROPOFOL 10 MG/ML IV BOLUS
INTRAVENOUS | Status: DC | PRN
  Administered 2020-06-04: 90 mg via INTRAVENOUS

## 2020-06-04 MED ORDER — BACLOFEN 10 MG PO TABS
10.0000 mg | ORAL_TABLET | Freq: Three times a day (TID) | ORAL | Status: DC
  Administered 2020-06-04 – 2020-06-06 (×6): 10 mg via ORAL
  Filled 2020-06-04 (×6): qty 1

## 2020-06-04 MED ORDER — VITAMIN D 25 MCG (1000 UNIT) PO TABS
1000.0000 [IU] | ORAL_TABLET | Freq: Every day | ORAL | Status: DC
  Administered 2020-06-05 – 2020-06-06 (×2): 1000 [IU] via ORAL
  Filled 2020-06-04 (×2): qty 1

## 2020-06-04 MED ORDER — THROMBIN 5000 UNITS EX SOLR
CUTANEOUS | Status: DC | PRN
  Administered 2020-06-04 (×2): 5000 [IU] via TOPICAL

## 2020-06-04 MED ORDER — ONDANSETRON HCL 4 MG/2ML IJ SOLN
INTRAMUSCULAR | Status: DC | PRN
  Administered 2020-06-04: 4 mg via INTRAVENOUS

## 2020-06-04 MED ORDER — BUPIVACAINE HCL (PF) 0.5 % IJ SOLN
INTRAMUSCULAR | Status: DC | PRN
  Administered 2020-06-04: 30 mL

## 2020-06-04 MED ORDER — DEXAMETHASONE SODIUM PHOSPHATE 10 MG/ML IJ SOLN
INTRAMUSCULAR | Status: DC | PRN
  Administered 2020-06-04: 10 mg via INTRAVENOUS

## 2020-06-04 MED ORDER — MIDAZOLAM HCL 2 MG/2ML IJ SOLN
INTRAMUSCULAR | Status: AC
  Filled 2020-06-04: qty 2

## 2020-06-04 MED ORDER — OXYCODONE HCL ER 10 MG PO T12A
10.0000 mg | EXTENDED_RELEASE_TABLET | Freq: Two times a day (BID) | ORAL | Status: DC
  Administered 2020-06-04 – 2020-06-06 (×4): 10 mg via ORAL
  Filled 2020-06-04 (×4): qty 1

## 2020-06-04 MED ORDER — ADULT MULTIVITAMIN W/MINERALS CH
1.0000 | ORAL_TABLET | Freq: Every day | ORAL | Status: DC
  Administered 2020-06-05 – 2020-06-06 (×2): 1 via ORAL
  Filled 2020-06-04 (×2): qty 1

## 2020-06-04 MED ORDER — MENTHOL 3 MG MT LOZG: 1.0000 | LOZENGE | OROMUCOSAL | Status: DC | PRN

## 2020-06-04 MED ORDER — ASCORBIC ACID 500 MG PO TABS
500.0000 mg | ORAL_TABLET | Freq: Every day | ORAL | Status: DC
  Administered 2020-06-05 – 2020-06-06 (×2): 500 mg via ORAL
  Filled 2020-06-04 (×2): qty 1

## 2020-06-04 MED ORDER — GABAPENTIN 400 MG PO CAPS
800.0000 mg | ORAL_CAPSULE | Freq: Three times a day (TID) | ORAL | Status: DC
  Administered 2020-06-04 – 2020-06-06 (×6): 800 mg via ORAL
  Filled 2020-06-04 (×6): qty 2

## 2020-06-04 MED ORDER — ACETAMINOPHEN 500 MG PO TABS
1000.0000 mg | ORAL_TABLET | Freq: Once | ORAL | Status: DC
  Filled 2020-06-04: qty 2

## 2020-06-04 MED ORDER — PHENOL 1.4 % MT LIQD: 1.0000 | OROMUCOSAL | Status: DC | PRN

## 2020-06-04 MED ORDER — ACETAMINOPHEN 325 MG PO TABS: 650.0000 mg | ORAL_TABLET | ORAL | Status: DC | PRN

## 2020-06-04 MED ORDER — POTASSIUM CHLORIDE IN NACL 20-0.9 MEQ/L-% IV SOLN: INTRAVENOUS | Status: DC

## 2020-06-04 MED ORDER — ORAL CARE MOUTH RINSE: 15.0000 mL | Freq: Once | OROMUCOSAL | Status: AC

## 2020-06-04 MED ORDER — ONDANSETRON HCL 4 MG PO TABS: 4.0000 mg | ORAL_TABLET | Freq: Four times a day (QID) | ORAL | Status: DC | PRN

## 2020-06-04 MED ORDER — PHENYLEPHRINE 40 MCG/ML (10ML) SYRINGE FOR IV PUSH (FOR BLOOD PRESSURE SUPPORT)
PREFILLED_SYRINGE | INTRAVENOUS | Status: DC | PRN
  Administered 2020-06-04 (×2): 80 ug via INTRAVENOUS

## 2020-06-04 MED ORDER — MIDAZOLAM HCL 2 MG/2ML IJ SOLN
INTRAMUSCULAR | Status: DC | PRN
  Administered 2020-06-04: 2 mg via INTRAVENOUS

## 2020-06-04 MED ORDER — LIDOCAINE-EPINEPHRINE 0.5 %-1:200000 IJ SOLN
INTRAMUSCULAR | Status: DC | PRN
  Administered 2020-06-04: 4 mL

## 2020-06-04 MED ORDER — LACTATED RINGERS IV SOLN: INTRAVENOUS | Status: DC

## 2020-06-04 MED ORDER — FENTANYL CITRATE (PF) 250 MCG/5ML IJ SOLN
INTRAMUSCULAR | Status: DC | PRN
  Administered 2020-06-04: 25 ug via INTRAVENOUS
  Administered 2020-06-04: 100 ug via INTRAVENOUS
  Administered 2020-06-04: 25 ug via INTRAVENOUS
  Administered 2020-06-04: 50 ug via INTRAVENOUS

## 2020-06-04 MED ORDER — CELECOXIB 200 MG PO CAPS
200.0000 mg | ORAL_CAPSULE | Freq: Two times a day (BID) | ORAL | Status: DC
  Administered 2020-06-04 – 2020-06-06 (×4): 200 mg via ORAL
  Filled 2020-06-04 (×4): qty 1

## 2020-06-04 MED ORDER — MAGNESIUM CITRATE PO SOLN
1.0000 | Freq: Once | ORAL | Status: AC | PRN
  Administered 2020-06-06: 1 via ORAL
  Filled 2020-06-04: qty 296

## 2020-06-04 MED ORDER — ZOLPIDEM TARTRATE 5 MG PO TABS: 5.0000 mg | ORAL_TABLET | Freq: Every evening | ORAL | Status: DC | PRN

## 2020-06-04 SURGICAL SUPPLY — 68 items
BAND RUBBER #18 3X1/16 STRL (MISCELLANEOUS) ×8 IMPLANT
BENZOIN TINCTURE PRP APPL 2/3 (GAUZE/BANDAGES/DRESSINGS) IMPLANT
BLADE CLIPPER SURG (BLADE) IMPLANT
BUR MATCHSTICK NEURO 3.0 LAGG (BURR) ×4 IMPLANT
BUR PRECISION FLUTE 5.0 (BURR) ×4 IMPLANT
CANISTER SUCT 3000ML PPV (MISCELLANEOUS) ×4 IMPLANT
CAP LCK SPNE (Orthopedic Implant) ×8 IMPLANT
CAP LOCK SPINE RADIUS (Orthopedic Implant) ×8 IMPLANT
CAP LOCKING (Orthopedic Implant) ×8 IMPLANT
CARTRIDGE OIL MAESTRO DRILL (MISCELLANEOUS) ×2 IMPLANT
CASCADIA AN CONVEX 8.5X28X11 (Cage) ×8 IMPLANT
CLOSURE WOUND 1/2 X4 (GAUZE/BANDAGES/DRESSINGS)
COVER BACK TABLE 60X90IN (DRAPES) ×4 IMPLANT
COVER WAND RF STERILE (DRAPES) ×4 IMPLANT
DECANTER SPIKE VIAL GLASS SM (MISCELLANEOUS) ×4 IMPLANT
DERMABOND ADVANCED (GAUZE/BANDAGES/DRESSINGS) ×2
DERMABOND ADVANCED .7 DNX12 (GAUZE/BANDAGES/DRESSINGS) ×2 IMPLANT
DIFFUSER DRILL AIR PNEUMATIC (MISCELLANEOUS) ×4 IMPLANT
DRAPE C-ARM 42X72 X-RAY (DRAPES) ×4 IMPLANT
DRAPE C-ARMOR (DRAPES) ×4 IMPLANT
DRAPE LAPAROTOMY 100X72X124 (DRAPES) ×4 IMPLANT
DRAPE MICROSCOPE LEICA (MISCELLANEOUS) ×4 IMPLANT
DRAPE SURG 17X23 STRL (DRAPES) ×4 IMPLANT
DRSG OPSITE POSTOP 4X6 (GAUZE/BANDAGES/DRESSINGS) ×4 IMPLANT
DURAPREP 26ML APPLICATOR (WOUND CARE) ×4 IMPLANT
ELECT REM PT RETURN 9FT ADLT (ELECTROSURGICAL) ×4
ELECTRODE REM PT RTRN 9FT ADLT (ELECTROSURGICAL) ×2 IMPLANT
GAUZE 4X4 16PLY RFD (DISPOSABLE) IMPLANT
GAUZE SPONGE 4X4 12PLY STRL (GAUZE/BANDAGES/DRESSINGS) IMPLANT
GLOVE BIO SURGEON STRL SZ8 (GLOVE) ×4 IMPLANT
GLOVE BIO SURGEON STRL SZ8.5 (GLOVE) ×4 IMPLANT
GLOVE BIOGEL PI IND STRL 6.5 (GLOVE) ×6 IMPLANT
GLOVE BIOGEL PI IND STRL 7.5 (GLOVE) ×2 IMPLANT
GLOVE BIOGEL PI INDICATOR 6.5 (GLOVE) ×6
GLOVE BIOGEL PI INDICATOR 7.5 (GLOVE) ×2
GLOVE ECLIPSE 6.5 STRL STRAW (GLOVE) ×4 IMPLANT
GLOVE EXAM NITRILE XL STR (GLOVE) IMPLANT
GLOVE SURG SS PI 6.0 STRL IVOR (GLOVE) ×20 IMPLANT
GLOVE SURG SS PI 7.0 STRL IVOR (GLOVE) ×4 IMPLANT
GOWN STRL REUS W/ TWL LRG LVL3 (GOWN DISPOSABLE) ×10 IMPLANT
GOWN STRL REUS W/ TWL XL LVL3 (GOWN DISPOSABLE) ×2 IMPLANT
GOWN STRL REUS W/TWL 2XL LVL3 (GOWN DISPOSABLE) IMPLANT
GOWN STRL REUS W/TWL LRG LVL3 (GOWN DISPOSABLE) ×10
GOWN STRL REUS W/TWL XL LVL3 (GOWN DISPOSABLE) ×2
INTERBODY CSCD AN CVX8.5X28X11 (Cage) ×4 IMPLANT
KIT BASIN OR (CUSTOM PROCEDURE TRAY) ×4 IMPLANT
KIT TURNOVER KIT B (KITS) ×4 IMPLANT
MILL MEDIUM DISP (BLADE) ×4 IMPLANT
NEEDLE HYPO 25X1 1.5 SAFETY (NEEDLE) ×4 IMPLANT
NEEDLE SPNL 18GX3.5 QUINCKE PK (NEEDLE) ×4 IMPLANT
NS IRRIG 1000ML POUR BTL (IV SOLUTION) ×4 IMPLANT
OIL CARTRIDGE MAESTRO DRILL (MISCELLANEOUS) ×4
PACK LAMINECTOMY NEURO (CUSTOM PROCEDURE TRAY) ×4 IMPLANT
PAD ARMBOARD 7.5X6 YLW CONV (MISCELLANEOUS) ×12 IMPLANT
ROD 5.5X30MM (Rod) ×4 IMPLANT
ROD RADIUS 35MM (Rod) ×4 IMPLANT
SCREW 5.75X40M (Screw) ×16 IMPLANT
SPONGE LAP 4X18 RFD (DISPOSABLE) IMPLANT
SPONGE SURGIFOAM ABS GEL SZ50 (HEMOSTASIS) ×4 IMPLANT
STRIP CLOSURE SKIN 1/2X4 (GAUZE/BANDAGES/DRESSINGS) IMPLANT
SUT PROLENE 6 0 BV (SUTURE) ×4 IMPLANT
SUT VIC AB 0 CT1 18XCR BRD8 (SUTURE) ×2 IMPLANT
SUT VIC AB 0 CT1 8-18 (SUTURE) ×2
SUT VIC AB 2-0 CT1 18 (SUTURE) ×8 IMPLANT
SUT VIC AB 3-0 SH 8-18 (SUTURE) ×8 IMPLANT
TOWEL GREEN STERILE (TOWEL DISPOSABLE) ×4 IMPLANT
TOWEL GREEN STERILE FF (TOWEL DISPOSABLE) ×4 IMPLANT
WATER STERILE IRR 1000ML POUR (IV SOLUTION) ×4 IMPLANT

## 2020-06-04 NOTE — H&P (Signed)
Mr. Keats comes in today for evaluation of pain, which he has in his lower back and right lower extremity.  This pain has been ongoing since January.  He has had pain like this in the past, but it was never as bad as it is right now.  He said he had an exacerbation about 2 weeks ago, and he had to stop working 1 week ago.  He works in Teacher, adult education, does a lot of lifting and bending.  He says almost all the pain is into the back and right lower extremity, radiates through the buttocks, posterior thigh, calf, occasionally will make it past the ankle but, for the most part, stays proximal to the ankle.  He is 67 inches, weighs 131 pounds, blood pressure is 146/74, pulse is 97, temperature is 96.8, respiratory rate is 18. Pain is 6/10.  He does work at [employer] as a Nurse, children's.  He says the pain has escalated over 6 months, then again most recently.     MEDICATIONS :  He takes Baclofen, Vitamin D, Gabapentin, Hydrocodone, Lamotrigine, Oxybutynin, Vitamin E.     ALLERGIES :  He has no known drug allergies.     REVIEW OF SYSTEMS :  Positive for fatigue, weight loss, muscle weakness, arm and leg pain, back pain.     MEDICAL HISTORY :  Significant for hypertension and he does have a history of multiple sclerosis.  He has some bowel and bladder incontinence, says the problem is not new.  He does not use alcohol.  Does use tobacco.  Has smoked cigarettes and has not indicated that he has quit.     EXAM :  He is alert, oriented by 4.  He answers all questions appropriately.  Does not appear to be in distress.  Memory, language, attention span, and fund of knowledge are normal.  Speech is clear. It is also fluent.  Hearing intact to voice.  Full extraocular movements.  Full visual fields.  Symmetric facial movements.  Shoulder shrug is normal.  5/5 strength in the upper extremities. Weakness bilaterally in the hip flexors.  He has good dorsiflexion and plantar flexion.  Reflexes  intact at the knees and ankles.  Normal muscle tone and bulk.  Gait is otherwise normal.  Romberg is negative.     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  The conus medullaris is normal.  The cauda equina is normal.  No paravertebral abscesses, infections, or extraosseous masses.     I believe Mr. Matsuo problem stems from the disc herniation at L3-4.  I believe this is the reason that he does have weakness in the hip flexors bilaterally and some weakness in the quadriceps, some mild weakness there.  I do believe, given the amount of time and the weakness that is present on exam, that operative decompression is in his best interest.  Risks and benefits of bleeding, infection, no relief, recurrent disc were discussed.  He was given a detailed sheet with regard to the operation.  He would like to proceed with operative decompression, and we will get this scheduled for the surgical center, and I expect a same-day procedure.

## 2020-06-04 NOTE — Op Note (Signed)
06/04/2020  3:29 PM  PATIENT:  ODEL SCHMID  64 y.o. 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 male whom was taken to the operating room intubated, and placed under a general anesthetic without difficulty. A foley catheter was placed under sterile conditions. He was positioned prone on a Wilson framewith all pressure points properly padded.  His lumbar region was prepped and draped in a sterile manner. I infiltrated 10cc's 1/2%lidocaine/1:2000,000 strength epinephrine into the planned incision. I opened the skin with a 10 blade and took the incision down to the thoracolumbar fascia. I exposed the lamina of L3, and 4 in a subperiosteal fashion bilaterally. I confirmed my location with an intraoperative xray.  I placed self retaining retractors and started the decompression. After performing a semihemilaminectomy of L3 on the left It was readily apparent that the 3/4 facet was completely incompetent, and that the level was grossly unstable. Given that I had to still decompress the right side I felt compelled to stabilize the spine and complete the decompression. This would necessitate an instrumented arthrodesis.  I decompressed the spinal canal via a laminectomy of L3 and inferior facetectomies of L3 bilaterally. I decompressed the spinal  cAnal and the lateral recesses with the laminectomy and facetectomy.  The dura was opened and the opening closed primarily with prolene. No leak observed after this was done. PLIF's were performed at L3/4 in the same fashion. I opened the disc space with a 15 blade then used a variety of instruments to remove the disc and prepare the space for the arthrodesis. I used curettes, rongeurs, punches, shavers for the disc space, and rasps in the discetomy. I measured the disc space and placed 60mmx26mm  titanium cages(stryker) into the disc space(s). The cages And the disc space was packed with autogrAft morsels  I placed pedicle screws at L3,and L4, using fluoroscopic guidance. I drilled a pilot hole, then cannulated the pedicle with a drill at each site. I then tapped each pedicle, assessing each site for pedicle violations. No cutouts were appreciated. Screws (Stryker K2) were then placed at each site without difficulty. I attached rods and locking caps with the appropriate tools. The locking caps were secured with torque limited screwdrivers. Final films were performed and the final construct appeared to be in good position.  We closed the wound in a layered fashion. We  approximated the thoracolumbar fascia, subcutaneous, and subcuticular planes with vicryl sutures. I used dermabond and an occlusive bandage for a sterile dressing.     PLAN OF CARE: Admit to inpatient   PATIENT DISPOSITION:  PACU - hemodynamically stable.   Delay start of Pharmacological VTE agent (>24hrs) due to surgical blood loss or risk of bleeding:  yes

## 2020-06-04 NOTE — Anesthesia Procedure Notes (Signed)
Procedure Name: Intubation Date/Time: 06/04/2020 11:18 AM Performed by: [name], CRNA Pre-anesthesia Checklist: Patient identified, Emergency Drugs available, Suction available and Patient being monitored Patient Re-evaluated:Patient Re-evaluated prior to induction Oxygen Delivery Method: Circle system utilized Preoxygenation: Pre-oxygenation with 100% oxygen Induction Type: IV induction Ventilation: Mask ventilation without difficulty Laryngoscope Size: Miller and 3 Grade View: Grade I Tube type: Oral Tube size: 7.5 mm Number of attempts: 1 Airway Equipment and Method: Stylet and Oral airway Placement Confirmation: ETT inserted through vocal cords under direct vision,  positive ETCO2 and breath sounds checked- equal and bilateral Secured at: 22 cm Tube secured with: Tape Dental Injury: Teeth and Oropharynx as per pre-operative assessment

## 2020-06-04 NOTE — Progress Notes (Signed)
Orthopedic Tech Progress Note Patient Details:  John Sullivan [birth year] [phone] RN called requesting for an LSO BRACE. Fitted patient with brace. Left at bedside Patient ID: John Sullivan, male   DOB: 04-Jun-1956, 64 y.o.   MRN: [id number]   [name] 06/04/2020, 5:41 PM

## 2020-06-04 NOTE — Transfer of Care (Signed)
Immediate Anesthesia Transfer of Care Note  Patient: John Sullivan  Procedure(s) Performed: Lumbar three-four Posterior Lumbar Interbody Fusion (Bilateral Back)  Patient Location: PACU  Anesthesia Type:General  Level of Consciousness: drowsy and patient cooperative  Airway & Oxygen Therapy: Patient Spontanous Breathing and Patient connected to face mask oxygen  Post-op Assessment: Report given to RN and Post -op Vital signs reviewed and stable  Post vital signs: Reviewed and stable  Last Vitals:  Vitals Value Taken Time  BP 112/63 06/04/20 1452  Temp    Pulse 82 06/04/20 1455  Resp 18 06/04/20 1455  SpO2 99 % 06/04/20 1455  Vitals shown include unvalidated device data.  Last Pain:  Vitals:   06/04/20 0716  TempSrc:   PainSc: 5       Patients Stated Pain Goal: 4 (50/56/97 9480)  Complications: No complications documented.

## 2020-06-04 NOTE — Anesthesia Postprocedure Evaluation (Signed)
Anesthesia Post Note  Patient: John Sullivan  Procedure(s) Performed: Lumbar three-four Posterior Lumbar Interbody Fusion (Bilateral Back)     Patient location during evaluation: PACU Anesthesia Type: General Level of consciousness: awake and alert Pain management: pain level controlled Vital Signs Assessment: post-procedure vital signs reviewed and stable Respiratory status: spontaneous breathing, nonlabored ventilation, respiratory function stable and patient connected to nasal cannula oxygen Cardiovascular status: blood pressure returned to baseline and stable Postop Assessment: no apparent nausea or vomiting Anesthetic complications: no   No complications documented.  Last Vitals:  Vitals:   06/04/20 1555 06/04/20 1610  BP: (!) 146/79 138/79  Pulse: 92 (!) 102  Resp: 14 17  Temp:    SpO2: 96% 97%    Last Pain:  Vitals:   06/04/20 1539  TempSrc:   PainSc: 8                  Lorenza Shakir,W. EDMOND

## 2020-06-05 NOTE — Evaluation (Signed)
Occupational Therapy Evaluation Patient Details Name: John Sullivan MRN: [id number] DOB: 1956-12-04 Today's Date: 06/05/2020    History of Present Illness Pt is a 64 y.o. male with PMH of MS, lumbar radiculopathy, R-sided sciatica, RLS, and chronic lymphocytic leukemia presenting with painin his lower back and R LE. that interferes with his ability to work. S/p L3-4 posterior lumbar interbody fusion.   Clinical Impression   PTA pt reports being independent with ADLs, working, and driving. Pt was admitted for above and treated for problem list below. Requires supervision with ADLs, transfers, and ambulation for safety and maintaining back precautions. Demonstrates good safety awareness and knowledge of precautions recalling 3/3 precautions and needing no cues for maintaining precautions during functional activities. Reports wife will be home 24/7, but cannot provide physical assist. Pt has a neighbor that can provide supervision PRN if any physical assist is needed. Education was given on compensatory techniques for dressing, grooming at sink, and toileting to maintain back precautions. Believe pt would benefit from initial 24/7 supervision at home with no further need for OT services. OT to sign off.     Follow Up Recommendations  No OT follow up    Equipment Recommendations  None recommended by OT       Precautions / Restrictions Precautions Precautions: Fall;Back Precaution Booklet Issued: Yes (comment) Precaution Comments: educated on back precautions (BLT) Required Braces or Orthoses: Other Brace Other Brace: lumbar corset Restrictions Weight Bearing Restrictions: No      Mobility Bed Mobility Overal bed mobility: Needs Assistance Bed Mobility: Rolling;Supine to Sit Rolling: Supervision   Supine to sit: Supervision     General bed mobility comments: pt demonstrated log roll with HOB slightly elevated. Supervision for adherence to back precautions  Transfers Overall  transfer level: Needs assistance   Transfers: Sit to/from Stand Sit to Stand: Supervision         General transfer comment: Supervision for safety    Balance Overall balance assessment: Needs assistance Sitting-balance support: No upper extremity supported;Feet supported Sitting balance-Leahy Scale: Fair Sitting balance - Comments: demonstrated don/doff socks with figure 4 technique. would rely on B UE for support when sitting and talking    Standing balance support: No upper extremity supported;During functional activity Standing balance-Leahy Scale: Fair Standing balance comment: pt reaching for environmental supports when navigating room - pt stated his house is cluttered and has a habit for reaching when ambulating.                           ADL either performed or assessed with clinical judgement   ADL Overall ADL's : Needs assistance/impaired     Grooming: Supervision/safety;Oral care;Brushing hair;Standing Grooming Details (indicate cue type and reason): supervision for maintaining back precautions Upper Body Bathing: Supervision/ safety;Standing Upper Body Bathing Details (indicate cue type and reason): supervision for safety Lower Body Bathing: Supervison/ safety;Sit to/from stand;Adhering to back precautions Lower Body Bathing Details (indicate cue type and reason): supervision for safety Upper Body Dressing : Supervision/safety;Standing Upper Body Dressing Details (indicate cue type and reason): pt prefers to don brace in standing. Supervision for safety Lower Body Dressing: Supervision/safety;Sit to/from stand;Adhering to back precautions Lower Body Dressing Details (indicate cue type and reason): supervision for safety Toilet Transfer: Supervision/safety;Grab Information systems manager Details (indicate cue type and reason): supervision for safety and maintenance of back precautions Toileting- Clothing Manipulation and Hygiene:  Supervision/safety;Adhering to back precautions;Sitting/lateral lean;Cueing for back precautions Toileting - Clothing Manipulation Details (  indicate cue type and reason): supervision for safety and maintenance of back precautions     Functional mobility during ADLs: Supervision/safety General ADL Comments: Pt needing supervision for ADLs with good safety awareness and knowledge of precautions     Vision Baseline Vision/History: Wears glasses Wears Glasses: At all times Patient Visual Report: No change from baseline Vision Assessment?: No apparent visual deficits            Pertinent Vitals/Pain Pain Assessment: Faces Faces Pain Scale: Hurts a little bit Pain Location: back - incisional Pain Descriptors / Indicators: Operative site guarding;Grimacing Pain Intervention(s): Monitored during session;Repositioned     Hand Dominance Right   Extremity/Trunk Assessment Upper Extremity Assessment Upper Extremity Assessment: Overall WFL for tasks assessed   Lower Extremity Assessment Lower Extremity Assessment: Defer to PT evaluation   Cervical / Trunk Assessment Cervical / Trunk Assessment: Normal   Communication Communication Communication: No difficulties   Cognition Arousal/Alertness: Awake/alert Behavior During Therapy: Impulsive Overall Cognitive Status: Within Functional Limits for tasks assessed                                 General Comments: pt able to recall 3/3 precautions and maintain precautions during functional activities without verbal cues   General Comments  Pt demonstrates good safety awareness and knowledge of back precautions. Pt reports he would like to get back to work and driving.            Home Living Family/patient expects to be discharged to:: Private residence Living Arrangements: Spouse/significant other Available Help at Discharge: Friend(s);Family;Available 24 hours/day;Available PRN/intermittently (wife home 24/7 but cannot  provide physical assist if needed) Type of Home: House Home Access: Stairs to enter;Ramped entrance Entrance Stairs-Number of Steps: 3 Entrance Stairs-Rails: Left Home Layout: One level     Bathroom Shower/Tub: Teacher, early years/pre: Handicapped height     Home Equipment: [place] - single point;Tub bench;Grab bars - toilet;Grab bars - tub/shower;Other (comment) (adjustable bed)   Additional Comments: Pt reports wife is B LE amputee and cannot provide phsyical assist but will be home 24/7 for supervision. Reports a neighbor can help PRN if needed for physical assist      Prior Functioning/Environment Level of Independence: Independent        Comments: driving and working in [employer] Problem List: Decreased safety awareness;Pain;Impaired balance (sitting and/or standing)      OT Treatment/Interventions:      OT Goals(Current goals can be found in the care plan section) Acute Rehab OT Goals Patient Stated Goal: go home OT Goal Formulation: With patient Time For Goal Achievement: 06/19/20 Potential to Achieve Goals: Good   AM-PAC OT "6 Clicks" Daily Activity     Outcome Measure Help from another person eating meals?: None Help from another person taking care of personal grooming?: A Little Help from another person toileting, which includes using toliet, bedpan, or urinal?: A Little Help from another person bathing (including washing, rinsing, drying)?: A Little Help from another person to put on and taking off regular upper body clothing?: A Little Help from another person to put on and taking off regular lower body clothing?: A Little 6 Click Score: 19   End of Session Equipment Utilized During Treatment: Gait belt Nurse Communication: Mobility status  Activity Tolerance: Patient tolerated treatment well Patient left: in chair;with call bell/phone within reach  OT  Visit Diagnosis: Unsteadiness on feet (R26.81);Other symptoms and signs  involving the nervous system (R29.898);Pain Pain - part of body:  (back - incisional)                Time: 8832-5498 OT Time Calculation (min): 24 min Charges:     Katasha Riga/OTS   Tayveon Lombardo 06/05/2020, 9:43 AM

## 2020-06-05 NOTE — Evaluation (Signed)
Physical Therapy Evaluation Patient Details Name: John Sullivan MRN: [id number] DOB: 1956-03-23 Today's Date: 06/05/2020   History of Present Illness  Pt is a 64 y.o. male with PMH of MS, lumbar radiculopathy, R-sided sciatica, RLS, and chronic lymphocytic leukemia presenting with painin his lower back and R LE. that interferes with his ability to work. S/p L3-4 posterior lumbar interbody fusion.  Clinical Impression  Pt admitted with above diagnosis. At the time of PT eval, pt was able to demonstrate transfers and ambulation with gross supervision for safety. 1 LOB in which knee buckled on stairs and mod assist was provided to recover and prevent fall. Pt was educated on precautions, brace application/wearing schedule, appropriate activity progression, and car transfer. Pt currently with functional limitations due to the deficits listed below (see PT Problem List). Pt will benefit from skilled PT to increase their independence and safety with mobility to allow discharge to the venue listed below.      Follow Up Recommendations No PT follow up;Supervision for mobility/OOB    Equipment Recommendations  None recommended by PT    Recommendations for Other Services       Precautions / Restrictions Precautions Precautions: Fall;Back Precaution Booklet Issued: Yes (comment) Precaution Comments: Reviewed handout and pt was cued for precautions during functional mobility.  Required Braces or Orthoses: Spinal Brace Spinal Brace: Lumbar corset;Applied in sitting position Restrictions Weight Bearing Restrictions: No      Mobility  Bed Mobility               General bed mobility comments: Verbally reviewed log roll technique. Pt was received in the chair.   Transfers Overall transfer level: Needs assistance Equipment used: None Transfers: Sit to/from Stand Sit to Stand: Supervision         General transfer comment: Supervision for safety  Ambulation/Gait Ambulation/Gait  assistance: Supervision Gait Distance (Feet): 300 Feet Assistive device: None Gait Pattern/deviations: Step-through pattern;Decreased stride length;Trunk flexed Gait velocity: Decreased Gait velocity interpretation: <1.8 ft/sec, indicate of risk for recurrent falls General Gait Details: Intermittent railing use. No overt LOB noted but pt appeared guarded at times.   Stairs Stairs: Yes Stairs assistance: Mod assist Stair Management: One rail Right;Step to pattern;Alternating pattern;Forwards Number of Stairs: 10 General stair comments: Pt ascended stairs without difficulty utilizing a step-to pattern. On descent, pt attempted an alternating step pattern and had a LOB (R knee buckle). Pt grabbed to railing with BUE's and therapist provided mod assist to prevent a fall.   Wheelchair Mobility    Modified Rankin (Stroke Patients Only)       Balance Overall balance assessment: Needs assistance Sitting-balance support: No upper extremity supported;Feet supported Sitting balance-Leahy Scale: Fair Sitting balance - Comments: demonstrated don/doff socks with figure 4 technique. would rely on B UE for support when sitting and talking    Standing balance support: No upper extremity supported;During functional activity Standing balance-Leahy Scale: Fair Standing balance comment: pt reaching for environmental supports when navigating room - pt stated his house is cluttered and has a habit for reaching when ambulating.                             Pertinent Vitals/Pain Pain Assessment: Faces Faces Pain Scale: Hurts a little bit Pain Location: back - incisional Pain Descriptors / Indicators: Operative site guarding;Grimacing Pain Intervention(s): Limited activity within patient's tolerance;Monitored during session;Repositioned    Home Living Family/patient expects to be discharged to:: Private residence Living  Arrangements: Spouse/significant other Available Help at Discharge:  Friend(s);Family;Available 24 hours/day;Available PRN/intermittently (wife home 24/7 but cannot provide physical assist if needed) Type of Home: House Home Access: Stairs to enter;Ramped entrance Entrance Stairs-Rails: Left Entrance Stairs-Number of Steps: 3 Home Layout: One level Home Equipment: Cane - quad;Cane - single point;Tub bench;Grab bars - toilet;Grab bars - tub/shower;Other (comment) (adjustable bed) Additional Comments: Pt reports wife is B LE amputee and cannot provide phsyical assist but will be home 24/7 for supervision. Reports a neighbor can help PRN if needed for physical assist    Prior Function Level of Independence: Independent         Comments: driving and working in [place]: Right    Extremity/Trunk Assessment   Upper Extremity Assessment Upper Extremity Assessment: Defer to OT evaluation    Lower Extremity Assessment Lower Extremity Assessment: Generalized weakness    Cervical / Trunk Assessment Cervical / Trunk Assessment: Other exceptions Cervical / Trunk Exceptions: s/p surgery  Communication   Communication: No difficulties  Cognition Arousal/Alertness: Awake/alert Behavior During Therapy: Impulsive Overall Cognitive Status: Within Functional Limits for tasks assessed                                        General Comments      Exercises     Assessment/Plan    PT Assessment Patient needs continued PT services  PT Problem List Decreased strength;Decreased activity tolerance;Decreased balance;Decreased mobility;Decreased knowledge of use of DME;Decreased safety awareness;Decreased knowledge of precautions;Pain       PT Treatment Interventions DME instruction;Gait training;Stair training;Functional mobility training;Therapeutic activities;Therapeutic exercise;Neuromuscular re-education;Patient/family education    PT Goals (Current goals can be found in the Care Plan section)  Acute  Rehab PT Goals Patient Stated Goal: go home PT Goal Formulation: With patient Time For Goal Achievement: 06/12/20 Potential to Achieve Goals: Good    Frequency Min 5X/week   Barriers to discharge        Co-evaluation               AM-PAC PT "6 Clicks" Mobility  Outcome Measure Help needed turning from your back to your side while in a flat bed without using bedrails?: None Help needed moving from lying on your back to sitting on the side of a flat bed without using bedrails?: None Help needed moving to and from a bed to a chair (including a wheelchair)?: None Help needed standing up from a chair using your arms (e.g., wheelchair or bedside chair)?: None Help needed to walk in hospital room?: None Help needed climbing 3-5 steps with a railing? : None 6 Click Score: 24    End of Session Equipment Utilized During Treatment: Gait belt;Back brace Activity Tolerance: Patient tolerated treatment well Patient left: in bed;with call bell/phone within reach (Sitting EOB) Nurse Communication: Mobility status PT Visit Diagnosis: Unsteadiness on feet (R26.81);Pain Pain - part of body:  (back)    Time: 1610-9604 PT Time Calculation (min) (ACUTE ONLY): 17 min   Charges:   PT Evaluation $PT Eval Low Complexity: 1 Low          Rolinda Roan, PT, DPT Acute Rehabilitation Services Pager: [phone] Office: [phone]   Thelma Comp 06/05/2020, 12:14 PM

## 2020-06-06 MED ORDER — TIZANIDINE HCL 4 MG PO TABS
4.0000 mg | ORAL_TABLET | Freq: Four times a day (QID) | ORAL | 0 refills | Status: DC | PRN
Start: 2020-06-06 — End: 2020-07-03

## 2020-06-06 NOTE — Discharge Summary (Signed)
  Physician Discharge Summary  Patient ID: John Sullivan MRN: [id number] DOB/AGE: 64-Jul-1957 64 y.o.  Admit date: 06/04/2020 Discharge date: 06/06/2020  Admission Diagnoses:spinal stenosis  Discharge Diagnoses:  Active Problems:   Spinal stenosis, lumbar region with neurogenic claudication   Discharged Condition: good  Hospital Course: John Sullivan was admitted and taken to the operating room for a simple decompression. 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 I therefore preceded with an arthrodesis. Post op he is ambulating, voiding, and tolerating a regular diet. His wound is clean, dry, no signs of infection at discharge.   Treatments: surgery: Lumbar three-four Posterior Lumbar Interbody Fusion L3/4, 42mm x 68mm cages Stryker, packed with autograft morsels Non segmental pedicle screw fixation L3/4 Stryker K2   Discharge Exam: Blood pressure (!) 147/83, pulse 93, temperature 98.6 F (37 C), temperature source Oral, resp. rate 16, height 5' 6.5" (1.689 m), weight 60.7 kg, SpO2 99 %. General appearance: alert, appears stated age and no distress  Disposition: Discharge disposition: 01-Home or Self Care      Disc displacement, Lumbar  Allergies as of 06/06/2020   No Known Allergies     Medication List    TAKE these medications   baclofen 10 MG tablet Commonly known as: LIORESAL Take 1 tablet (10 mg total) by mouth 3 (three) times daily.   cholecalciferol 1000 units tablet Commonly known as: VITAMIN D Take 1,000 Units by mouth daily.   docusate sodium 100 MG capsule Commonly known as: COLACE Take 100 mg by mouth daily as needed for mild constipation.   gabapentin 800 MG tablet Commonly known as: NEURONTIN TAKE 1 TABLET BY MOUTH THREE TIMES A DAY   glatiramer 20 MG/ML Sosy injection Commonly known as: COPAXONE Inject 1 mL (20 mg total) into the skin daily.   HYDROcodone-acetaminophen 7.5-325 MG  tablet Commonly known as: NORCO Take 1 tablet by mouth 2 (two) times daily as needed for moderate pain.   lamoTRIgine 100 MG tablet Commonly known as: LAMICTAL TAKE 1 TABLET BY MOUTH TWICE A DAY   multivitamin capsule Take 1 capsule by mouth daily.   oxybutynin 5 MG tablet Commonly known as: DITROPAN Take 1 tablet (5 mg total) by mouth 2 (two) times daily.   tiZANidine 4 MG tablet Commonly known as: ZANAFLEX Take 1 tablet (4 mg total) by mouth every 6 (six) hours as needed for muscle spasms.   VITAMIN C PO Take 1 tablet by mouth daily.   VITAMIN E PO Take 1 Dose by mouth daily.       Follow-up Information    Ashok Pall, MD Follow up in 3 week(s).   Specialty: Neurosurgery Why: please call the office to make an appointment Contact information: [address] [place] [address] [phone]               Signed: Ashok Pall 06/06/2020, 12:23 PM

## 2020-06-06 NOTE — Discharge Instructions (Addendum)
Spinal Fusion Care After Refer to this sheet in the next few weeks. These instructions provide you with information on caring for yourself after your procedure. Your caregiver may also give you more specific instructions. Your treatment has been planned according to current medical practices, but problems sometimes occur. Call your caregiver if you have any problems or questions after your procedure. HOME CARE INSTRUCTIONS   Take whatever pain medicine has been prescribed by your caregiver. Do not take over-the-counter pain medicine unless directed otherwise by your caregiver.   Do not drive if you are taking narcotic pain medicines.   Change your bandage (dressing) if necessary or as directed by your caregiver.   You may shower. The wound may get wet, simply pat the area dry. It will take ~2 weeks for the glue to peel off.  If you have been prescribed medicine to prevent your blood from clotting, follow the directions carefully.   Check the area around your incision often. Look for redness and swelling. Also, look for anything leaking from your wound. You can use a mirror or have a family member inspect your incision if it is in a place where it is difficult for you to see.   Ask your caregiver what activities you should avoid and for how long.   Walk as much as possible.   Do not lift anything heavier than 5 lbs until your caregiver says it is safe.   Do not twist or bend for a few weeks. Try not to pull on things. Avoid sitting for long periods of time. Change positions at least every hour.   Wound Care Leave incision open to air. You may shower. Do not scrub directly on incision.  Do not put any creams, lotions, or ointments on incision. Activity Walk each and every day, increasing distance each day. No lifting greater than 5 lbs.  Avoid bending, arching, and twisting. No driving for 2 weeks; may ride as a passenger locally. If provided with back brace, wear when out of bed.  It  is not necessary to wear in bed. Diet Resume your normal diet.  Return to Work Will be discussed at you follow up appointment. Call Your Doctor If Any of These Occur Redness, drainage, or swelling at the wound.  Temperature greater than 101 degrees. Severe pain not relieved by pain medication. Incision starts to come apart. Follow Up Appt Call today for appointment in 2-4 weeks ([phone]) or for problems.  If you have any hardware placed in your spine, you will need an x-ray before your appointment.

## 2020-06-06 NOTE — Progress Notes (Signed)
Patient is discharged from room 3C09 at this time. Alert and in stable condition. IV site d/c'd and instructions read to patient with understanding verbalized and all questions answered. Ambulate out of unit with all belongings at side. 

## 2020-06-06 NOTE — TOC Transition Note (Signed)
Transition of Care [facility]) - CM/SW Discharge Note   Patient Details  Name: John Sullivan MRN: [id number] Date of Birth: [birth year]  [facility] CM/SW Contact:  Angelita Ingles, RN Phone Number: [phone]  06/06/2020, 1:43 PM   Clinical Narrative:   CM consulted to assist patient in transportation home. Patient  Lives in [place] and is post surgical with no means of transportation home. Taxi voucher provided. Voucher given to bedside nurse. CM will sign off. No further needs noted at this time.     Final next level of care: Home/Self Care Barriers to Discharge: No Barriers Identified   Patient Goals and CMS Choice Patient states their goals for this hospitalization and ongoing recovery are:: To go home with wife   Choice offered to / list presented to : NA  Discharge Placement                       Discharge Plan and Services                DME Arranged: N/A DME Agency: NA       HH Arranged: NA HH Agency: NA        Social Determinants of Health (SDOH) Interventions     Readmission Risk Interventions No flowsheet data found.

## 2020-06-06 NOTE — Progress Notes (Signed)
Physical Therapy Treatment Patient Details Name: John Sullivan MRN: [id number] DOB: 1956/11/11 Today's Date: 06/06/2020    History of Present Illness Pt is a 64 y.o. male with PMH of MS, lumbar radiculopathy, R-sided sciatica, RLS, and chronic lymphocytic leukemia presenting with painin his lower back and R LE. that interferes with his ability to work. S/p L3-4 posterior lumbar interbody fusion.    PT Comments    Pt making steady progress overall towards achieving his current functional mobility goals. Plan is to d/c home today. Pt is ready for d/c from PT perspective.    Follow Up Recommendations  No PT follow up;Supervision for mobility/OOB     Equipment Recommendations  None recommended by PT    Recommendations for Other Services       Precautions / Restrictions Precautions Precautions: Fall;Back Precaution Comments: pt able to recall 3/3 back precautions Required Braces or Orthoses: Spinal Brace Spinal Brace: Lumbar corset;Applied in sitting position Restrictions Weight Bearing Restrictions: No    Mobility  Bed Mobility               General bed mobility comments: pt OOB in recliner chair upon arrival  Transfers Overall transfer level: Needs assistance Equipment used: None Transfers: Sit to/from Stand Sit to Stand: Supervision         General transfer comment: Supervision for safety  Ambulation/Gait Ambulation/Gait assistance: Supervision Gait Distance (Feet): 500 Feet Assistive device: None Gait Pattern/deviations: Step-through pattern;Decreased stride length Gait velocity: Decreased   General Gait Details: Intermittent railing use. No overt LOB noted but pt appeared guarded at times.    Stairs Stairs: Yes Stairs assistance: Min guard Stair Management: One rail Left;Step to pattern;Forwards Number of Stairs: 10 General stair comments: pt able to perform without difficulties   Wheelchair Mobility    Modified Rankin (Stroke Patients  Only)       Balance Overall balance assessment: Needs assistance Sitting-balance support: No upper extremity supported;Feet supported Sitting balance-Leahy Scale: Good     Standing balance support: No upper extremity supported Standing balance-Leahy Scale: Fair                              Cognition Arousal/Alertness: Awake/alert Behavior During Therapy: WFL for tasks assessed/performed Overall Cognitive Status: Within Functional Limits for tasks assessed                                        Exercises      General Comments        Pertinent Vitals/Pain Pain Assessment: Faces Faces Pain Scale: Hurts a little bit Pain Location: back - incisional Pain Descriptors / Indicators: Operative site guarding;Grimacing Pain Intervention(s): Monitored during session;Repositioned    Home Living                      Prior Function            PT Goals (current goals can now be found in the care plan section) Acute Rehab PT Goals PT Goal Formulation: With patient Time For Goal Achievement: 06/12/20 Potential to Achieve Goals: Good Progress towards PT goals: Progressing toward goals    Frequency    Min 5X/week      PT Plan Current plan remains appropriate    Co-evaluation              AM-PAC  PT "6 Clicks" Mobility   Outcome Measure  Help needed turning from your back to your side while in a flat bed without using bedrails?: None Help needed moving from lying on your back to sitting on the side of a flat bed without using bedrails?: None Help needed moving to and from a bed to a chair (including a wheelchair)?: None Help needed standing up from a chair using your arms (e.g., wheelchair or bedside chair)?: None Help needed to walk in hospital room?: None Help needed climbing 3-5 steps with a railing? : None 6 Click Score: 24    End of Session Equipment Utilized During Treatment: Back brace Activity Tolerance: Patient  tolerated treatment well Patient left: in chair;with call bell/phone within reach Nurse Communication: Mobility status PT Visit Diagnosis: Unsteadiness on feet (R26.81)     Time: 4650-3546 PT Time Calculation (min) (ACUTE ONLY): 15 min  Charges:  $Gait Training: 8-22 mins                     Anastasio Champion, DPT  Acute Rehabilitation Services Pager [phone] Office [place] 06/06/2020, 10:00 AM

## 2020-07-03 ENCOUNTER — Encounter: Payer: Self-pay | Admitting: Neurology

## 2020-07-03 ENCOUNTER — Ambulatory Visit: Payer: BC Managed Care – PPO | Admitting: Neurology

## 2020-07-03 VITALS — BP 154/90 | HR 87 | Ht 66.5 in | Wt 129.5 lb

## 2020-07-03 DIAGNOSIS — R350 Frequency of micturition: Secondary | ICD-10-CM

## 2020-07-03 DIAGNOSIS — G35 Multiple sclerosis: Secondary | ICD-10-CM | POA: Diagnosis not present

## 2020-07-03 DIAGNOSIS — M5431 Sciatica, right side: Secondary | ICD-10-CM

## 2020-07-03 DIAGNOSIS — G2581 Restless legs syndrome: Secondary | ICD-10-CM | POA: Diagnosis not present

## 2020-07-03 DIAGNOSIS — R5383 Other fatigue: Secondary | ICD-10-CM

## 2020-07-03 DIAGNOSIS — R208 Other disturbances of skin sensation: Secondary | ICD-10-CM

## 2020-07-03 MED ORDER — TIZANIDINE HCL 4 MG PO TABS: 4.0000 mg | ORAL_TABLET | Freq: Four times a day (QID) | ORAL | 3 refills | Status: AC | PRN

## 2020-07-03 MED ORDER — GABAPENTIN 800 MG PO TABS: 800.0000 mg | ORAL_TABLET | Freq: Three times a day (TID) | ORAL | 3 refills | Status: AC

## 2020-07-03 MED ORDER — TIZANIDINE HCL 4 MG PO TABS: 4.0000 mg | ORAL_TABLET | Freq: Four times a day (QID) | ORAL | 3 refills | Status: DC | PRN

## 2020-07-03 MED ORDER — OXYBUTYNIN CHLORIDE 5 MG PO TABS: 5.0000 mg | ORAL_TABLET | Freq: Two times a day (BID) | ORAL | 3 refills | Status: AC

## 2020-07-03 MED ORDER — LAMOTRIGINE 100 MG PO TABS: 100.0000 mg | ORAL_TABLET | Freq: Two times a day (BID) | ORAL | 3 refills | Status: AC

## 2020-07-03 MED ORDER — HYDROCODONE-ACETAMINOPHEN 7.5-325 MG PO TABS: 1.0000 | ORAL_TABLET | Freq: Two times a day (BID) | ORAL | 0 refills | Status: DC | PRN

## 2020-07-03 NOTE — Progress Notes (Signed)
[facility]  PATIENT: John Sullivan DOB: 1956-09-27  REFERRING DOCTOR OR PCP:  None SOURCE: patient and wife  _________________________________   HISTORICAL  CHIEF COMPLAINT:  Chief Complaint  Patient presents with  . Follow-up    RM 12 with spouse. Last seen 02/26/2020.   . Multiple Sclerosis    On Copaxone.   . Dysesthesia    Takes lamotrigine, gabapentin   . Lumbar radiculopathy    Takes hydrocodone  . Medication Refill    Requesting refills on baclofen, gabapentin, hydrocodone, lamotrigine, oxybutynin    HISTORY OF PRESENT ILLNESS:  Sorin Frimpong is a 64 y.o. man with relapsing remitting MS.   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  He has no exacerbations for many years.  He is on glatiramer but misses some doses.  He has stable mild gait disturbance and urinary frequency.    He has CLL and is just doing watchful waiting.  He sees [facility]low [facility].  Due to back pain and radiculopathy, MRI was performed showing disc herniations at L3-L4 and L4-L5.  He had an L3L4 PLIF (Dr. Christella Noa) 06/04/20.   He has less pain but still notes some.   He is not able to do much lifting yet.   He is walking well.   The right hip and leg pain is much better.   He is on baclofen, gabapentin, hydrocodone for pain.     He has some fatigue and sleepiness.  He sleeps but has been noted to snore and he sometimes has daytime somnolence.  He has not wanted to do a polysomnogram.  He feels mood is doing well.  Bladder function has done well with the treatment.  MS History:   He was diagnosed with MS in 2001 after presenting with numbness in the hands and legs and vertigo.  He saw Dr. Nicole Kindred after symptoms persisted.   He had an MRI and LP, both consistent with MS.    He went on Rebif x 18 months but did not tolerate it well. He switched to see me around 2004.   MRI's have been stable on Copaxone.    His sister has MS, also   REVIEW OF SYSTEMS: Constitutional: No fevers, chills, sweats, or change in appetite.    Mild fatigue Eyes: No visual changes, double vision, eye pain Ear, nose and throat: No hearing loss, ear pain, nasal congestion, sore throat Cardiovascular: No chest pain, palpitations Respiratory: No shortness of breath at rest or with exertion.   No wheezes GastrointestinaI: No nausea, vomiting, diarrhea, abdominal pain, fecal incontinence Genitourinary: No dysuria, urinary retention or frequency.  No nocturia. Musculoskeletal: No current neck pain, mils back pain Integumentary: No rash, pruritus, skin lesions Neurological: as above Psychiatric: No depression at this time.  No anxiety Endocrine: No palpitations, diaphoresis, change in appetite, change in weigh or increased thirst Hematologic/Lymphatic: No anemia, purpura, petechiae. Allergic/Immunologic: No itchy/runny eyes, nasal congestion, recent allergic reactions, rashes  ALLERGIES: No Known Allergies  HOME MEDICATIONS:  Current Outpatient Medications:  .  Ascorbic Acid (VITAMIN C PO), Take 1 tablet by mouth daily. , Disp: , Rfl:  .  cholecalciferol (VITAMIN D) 1000 UNITS tablet, Take 1,000 Units by mouth daily., Disp: , Rfl:  .  docusate sodium (COLACE) 100 MG capsule, Take 100 mg by mouth daily as needed for mild constipation., Disp: , Rfl:  .  gabapentin (NEURONTIN) 800 MG tablet, Take 1  tablet (800 mg total) by mouth 3 (three) times daily., Disp: 270 tablet, Rfl: 3 .  glatiramer (COPAXONE) 20 MG/ML SOSY injection, Inject 1 mL (20 mg total) into the skin daily., Disp: 30 each, Rfl: 11 .  HYDROcodone-acetaminophen (NORCO) 7.5-325 MG tablet, Take 1 tablet by mouth 2 (two) times daily as needed for moderate pain., Disp: 60 tablet, Rfl: 0 .  lamoTRIgine (LAMICTAL) 100 MG tablet, Take 1 tablet (100 mg total) by mouth 2 (two) times daily., Disp: 180 tablet, Rfl: 3 .  Multiple Vitamin (MULTIVITAMIN) capsule, Take 1  capsule by mouth daily., Disp: , Rfl:  .  oxybutynin (DITROPAN) 5 MG tablet, Take 1 tablet (5 mg total) by mouth 2 (two) times daily., Disp: 180 tablet, Rfl: 3 .  VITAMIN E PO, Take 1 Dose by mouth daily., Disp: , Rfl:  .  tiZANidine (ZANAFLEX) 4 MG tablet, Take 1 tablet (4 mg total) by mouth every 6 (six) hours as needed for muscle spasms., Disp: 360 tablet, Rfl: 3  PAST MEDICAL HISTORY: Past Medical History:  Diagnosis Date  . Cancer ([place])    CLL-Leukemia dx 03/26/20  . COPD (chronic obstructive pulmonary disease) (HCC)    minor   . Hypertension   . Multiple sclerosis ([place])   . Neuropathy   . Vision abnormalities     PAST SURGICAL HISTORY: Past Surgical History:  Procedure Laterality Date  . INGUINAL HERNIA REPAIR  05/2020  . TONSILLECTOMY    . TONSILLECTOMY AND ADENOIDECTOMY    . WISDOM TOOTH EXTRACTION      FAMILY HISTORY: Family History  Problem Relation Age of Onset  . AAA (abdominal aortic aneurysm) Mother   . Diabetes Father     SOCIAL HISTORY:  Social History   Socioeconomic History  . Marital status: Married    Spouse name: Not on file  . Number of children: Not on file  . Years of education: Not on file  . Highest education level: Not on file  Occupational History  . Not on file  Tobacco Use  . Smoking status: Current Every Day Smoker    Packs/day: 0.50    Types: Cigarettes  . Smokeless tobacco: Never Used  Vaping Use  . Vaping Use: Never used  Substance and Sexual Activity  . Alcohol use: Yes    Alcohol/week: 0.0 standard drinks    Comment: rare-1 beer q2weeks.   . Drug use: Yes    Types: Marijuana    Comment: every night for MS. 2-3 puffs.   . Sexual activity: Not on file  Other Topics Concern  . Not on file  Social History Narrative  . Not on file   Social Determinants of Health   Financial Resource Strain:   . Difficulty of Paying Living Expenses:   Food Insecurity:   . Worried About Charity fundraiser in the Last Year:   . Arts development officer in the Last Year:   Transportation Needs:   . Film/video editor (Medical):   Marland Kitchen Lack of Transportation (Non-Medical):   Physical Activity:   . Days of Exercise per Week:   . Minutes of Exercise per Session:   Stress:   . Feeling of Stress :   Social Connections:   . Frequency of Communication with Friends and Family:   . Frequency of Social Gatherings with Friends and Family:   . Attends Religious Services:   . Active Member of Clubs or Organizations:   . Attends Archivist Meetings:   .  Marital Status:   Intimate Partner Violence:   . Fear of Current or Ex-Partner:   . Emotionally Abused:   Marland Kitchen Physically Abused:   . Sexually Abused:      PHYSICAL EXAM  Vitals:   07/03/20 1453  BP: (!) 154/90  Pulse: 87  Weight: 129 lb 8 oz (58.7 kg)  Height: 5' 6.5" (1.689 m)    Body mass index is 20.59 kg/m.   General: The patient is well-developed and well-nourished and in no acute distress  Musculoskeletal: .  The neck is nontender.  He has mild left greater than right piriformis muscle tenderness right now.  This is better than last visit.  Neurologic Exam  Mental status: The patient is alert and oriented x 3 at the time of the examination. The patient has apparent normal recent and remote memory, with an apparently normal attention span and concentration ability.   Speech is normal.  Cranial nerves: Extraocular movements are full.  Facial strength is normal.  No obvious hearing deficits are noted but hearing slightly better on left.    Motor:  Muscle bulk is normal.  Muscle tone is slightly increased in the legs.  Strength is 5/5 in the arms and legs except 4+/5 right EHL muscle  Sensory: He has intact sensation to touch and vibration in the arms but reduced sensation to touch in the L5 distribution of the right leg  Coordination: Cerebellar testing reveals good finger-nose-finger and heel-to-shin bilaterally.  Gait and station: Station is normal.   The gait is arthritic and the tandem gait is wide.  Romberg is negative.  Reflexes: Deep tendon reflexes are symmetric and normal bilaterally.          ASSESSMENT AND PLAN  Multiple sclerosis (HCC)  Right sided sciatica  Dysesthesia  Restless leg  Other fatigue  Urinary frequency    1.   His MS has been stable for many years on Copaxone.  Recent MRI shows no progression.  We have discussed stopping the glatiramer.  He continues to take intermittently.   2.  Continue hydrocodone and gabapentin.  Pain has been better since surgery.   3.    Continue lamotrigine and gabapentin for dysesthesias and restless legs.  4.    Stay active and exercise as tolerated.  If fatigue or sleepiness worsens we will rediscuss PSG 5.    Return in 4 months or sooner if there are new or worsening neurologic symptoms.    Zaeda Mcferran A. Felecia Shelling, MD, PhD 9/67/8938, 10:17 PM Certified in Neurology, Clinical Neurophysiology, Sleep Medicine, Pain Medicine and Neuroimaging  [facility] [address], [place], [place] [address] [phone]

## 2020-08-18 ENCOUNTER — Other Ambulatory Visit: Payer: Self-pay | Admitting: Neurology

## 2020-08-18 MED ORDER — HYDROCODONE-ACETAMINOPHEN 7.5-325 MG PO TABS: 1.0000 | ORAL_TABLET | Freq: Two times a day (BID) | ORAL | 0 refills | Status: DC | PRN

## 2020-08-18 NOTE — Telephone Encounter (Signed)
Pt request refill HYDROcodone-acetaminophen (NORCO) 7.5-325 MG tablet at [id number]

## 2020-09-30 ENCOUNTER — Other Ambulatory Visit: Payer: Self-pay | Admitting: Neurology

## 2020-09-30 MED ORDER — HYDROCODONE-ACETAMINOPHEN 7.5-325 MG PO TABS: 1.0000 | ORAL_TABLET | Freq: Two times a day (BID) | ORAL | 0 refills | Status: AC | PRN

## 2020-09-30 NOTE — Telephone Encounter (Signed)
Pt is needing a refill on his HYDROcodone-acetaminophen (NORCO) 7.5-325 MG tablet [facility] [address]

## 2020-11-04 ENCOUNTER — Ambulatory Visit: Payer: BC Managed Care – PPO | Admitting: Neurology

## 2020-11-26 ENCOUNTER — Telehealth: Payer: Self-pay | Admitting: Neurology

## 2020-11-26 NOTE — Telephone Encounter (Signed)
Pt. Was called today to collect the fee for his Guardian ins. Form to be completed but stated he was not able to pay.

## 2021-03-03 ENCOUNTER — Ambulatory Visit: Payer: BC Managed Care – PPO | Admitting: Neurology

## 2021-04-03 ENCOUNTER — Other Ambulatory Visit: Payer: Self-pay | Admitting: Neurology

## 2021-04-17 ENCOUNTER — Other Ambulatory Visit: Payer: Self-pay | Admitting: Internal Medicine

## 2021-04-17 NOTE — Telephone Encounter (Signed)
Needs records to be transferred to our office.

## 2021-04-17 NOTE — Telephone Encounter (Signed)
Last Appointment: Visit date not found  Next Appointment:   Future Appointments   Date Time Provider [place]   09/28/2021  2:20 PM Montalbo, Osborne Casco, MD [facility] None     No CSA on file    This report was requested by: Adriana Mccallum   Reference #: [id number]   There are no results for the search terms that you entered.       2022 [facility]

## 2021-04-17 NOTE — Telephone Encounter (Signed)
Has an appointment with [name] next week

## 2021-04-17 NOTE — Telephone Encounter (Signed)
Patient and patients wife called their old PCP to see if some records can be sent. New patient paper work will be given when they come in for their appointments. They wont get it in time if it is mailed.

## 2021-04-17 NOTE — Addendum Note (Signed)
Addended byPura Spice on: 04/17/2021 02:52 PM     Modules accepted: Orders

## 2021-04-17 NOTE — Addendum Note (Signed)
Addended by: Bonita Quin A on: 04/17/2021 12:28 PM     Modules accepted: Orders

## 2021-04-17 NOTE — Telephone Encounter (Signed)
Patient's wife is calling. Has a new patient appointment October 24th. Needs refills. Please advise on appointment.    Needs refills:  Gabapentin 800 mg, 2 times daily  Hydrocodone Acetaminophen 7 1/2 325, 1 2 times daily   Lamotrigine 100 mg, 2 times daily  Oxybutynin 5 mg, 2 times daily     [facility]

## 2021-04-17 NOTE — Telephone Encounter (Signed)
Significant meds being requested.  I don't see any records. Any that we can obtain soon?  Who is prior PCP to cover?  Will need quick visit to establish.  Thanks

## 2021-04-20 MED ORDER — LAMOTRIGINE 100 MG PO TABS *I*
100.0000 mg | ORAL_TABLET | Freq: Two times a day (BID) | ORAL | 0 refills | Status: DC
Start: 2021-04-20 — End: 2021-07-20

## 2021-04-20 MED ORDER — GABAPENTIN 800 MG PO TABLET *I*
800.0000 mg | ORAL_TABLET | Freq: Two times a day (BID) | ORAL | 0 refills | Status: DC
Start: 2021-04-20 — End: 2021-07-23

## 2021-04-20 MED ORDER — OXYBUTYNIN CHLORIDE 5 MG PO TABS *I*
5.0000 mg | ORAL_TABLET | Freq: Two times a day (BID) | ORAL | 1 refills | Status: DC
Start: 2021-04-20 — End: 2021-10-19

## 2021-04-20 NOTE — Telephone Encounter (Signed)
Please let patient know to discuss pain medication management, patient needs to be seen first. He has upcoming appt in 5/20. All other meds were refilled. thanks

## 2021-04-20 NOTE — Telephone Encounter (Signed)
Call placed to patient who reports his last refill for Norco was in [place].    Last refill bottle date 09/30/2020. He states he takes this for back pain with history of slipped disc. He reports surgery was done to fuse L3 & L4.

## 2021-04-21 NOTE — Telephone Encounter (Signed)
Call placed to patient with update reviewed per NP.    Patient voiced understanding and agreed with plan.

## 2021-04-24 ENCOUNTER — Ambulatory Visit: Payer: BLUE CROSS/BLUE SHIELD | Admitting: Adult Health

## 2021-04-24 ENCOUNTER — Encounter: Payer: Self-pay | Admitting: Adult Health

## 2021-04-24 VITALS — BP 126/86 | HR 90 | Temp 98.0°F | Ht 67.0 in | Wt 138.8 lb

## 2021-04-24 DIAGNOSIS — C9110 Chronic lymphocytic leukemia of B-cell type not having achieved remission: Secondary | ICD-10-CM

## 2021-04-24 DIAGNOSIS — G35 Multiple sclerosis: Secondary | ICD-10-CM

## 2021-04-24 DIAGNOSIS — Z7689 Persons encountering health services in other specified circumstances: Secondary | ICD-10-CM

## 2021-04-24 DIAGNOSIS — Z981 Arthrodesis status: Secondary | ICD-10-CM

## 2021-04-24 DIAGNOSIS — Z139 Encounter for screening, unspecified: Secondary | ICD-10-CM

## 2021-04-24 DIAGNOSIS — R32 Unspecified urinary incontinence: Secondary | ICD-10-CM

## 2021-04-24 DIAGNOSIS — Z720 Tobacco use: Secondary | ICD-10-CM

## 2021-04-24 NOTE — Progress Notes (Signed)
CC:  Patient presents today to establish care and discuss acute concerns as outlined below  HPI:    1) encounter to establish care:  -Patient reports he did not have a previous PCP.  -He did have a neurologist to follow him up for multiple sclerosis, and he also reports he had an oncologist that followed him for CLL.  Patient also reports that he had an neurosurgeon who took care of his back (he had a fusion."    2) urinary incontinence:  Currently taking oxybutynin 5 mg 3 times daily.provided with neuro dt M S.  -Patient reports doing well with the medication, reports no concerns at this time.    3) M S:  -Patient reports symptoms started with neuropathy.  -Currently he takes gabapentin 800 mg twice daily, and Lamotrigine 100 mg twice daily.  -Patient reports he used to see Dr. Laurie Panda neurology at [facility] in [facility].   -We discussed doing a referral to neurology for follow-up, he reports that he has been told that "this is a very mild case" so he just wants to continue monitoring for now.  He does not want a referral to neuro.    4) CLL:  -Patient reports that after he had his back surgery, he did some blood work and he was contacted and let know that "my blood work was out of [employer]".  The doctor thought it likely was cancer.   -He has since followed with Dr Harlow Asa oncologist.  -He also declines follow-up with oncology at this time.  Agrees to do blood work.    5) tobacco use:  Patient reports smoking half a pack a day.  Not ready to stop smoking.    6) Back pain:  -Patient reports that he had back fusion.  This happened last July 2021.  Patient reports his surgeon's name is Dr. Cyndy Freeze at [facility].  -Patient reports that he was taking hydrocodone acetaminophen (Norco) 7.5-325 tablet however, he was not taking as prescribed for twice daily, patient reports that he would take half a tablet "only as needed".  -Last refilled per FYI in [place] 09/30/2020.  Patient  reports that he still has 3 Tablets, he would not like to run out of them.  He is aware that I would not be prescribing any pain medication at this time until we receive the information from his back surgeon.  -Patient is aware that there is a pain medication service or the spine specialist clinic if he is in pain.      Medications reviewed and confirmed with the patient.  Current Outpatient Medications   Medication Sig Dispense Refill    Multiple Vitamin (MULTIVITAMIN PO) Take by mouth daily      TURMERIC CURCUMIN PO Take 500 mg by mouth daily      Multiple Vitamins-Minerals (LUTEIN-ZEAXANTHIN PO) Take 20 mg by mouth daily      cholecalciferol, Vitamin D3, (VITAMIN D) 50 mcg (2,000 unit) tablet Take 6,000 units by mouth daily      B Complex Vitamins (VITAMIN-B COMPLEX PO) Take by mouth daily      Zinc 50 MG TABS Take by mouth daily      Fish Oil 1000 MG CAPS capsule Take 1 g by mouth daily      VITAMIN E PO Take 180 mg by mouth daily      Lactobacillus (ACIDOPHILUS PROBIOTIC PO) Take by mouth daily      Alpha-Lipoic Acid 200 MG CAPS Take  200 mg by mouth daily      Specialty Vitamins Products (COLLAGEN ULTRA) CAPS Take 2.5 g by mouth daily      Misc Natural Products (COMPLETE PROSTATE HEALTH) TABS Take by mouth daily      gabapentin (NEURONTIN) 800mg  tablet Take 1 tablet (800 mg total) by mouth 2 times daily (Patient taking differently: Take 800 mg by mouth 3 times daily  ) 180 tablet 0    oxybutynin (DITROPAN) 5 mg tablet Take 1 tablet (5 mg total) by mouth 2 times daily 180 tablet 1    lamoTRIgine (LAMICTAL) 100 mg tablet Take 1 tablet (100 mg total) by mouth 2 times daily 180 tablet 0    HYDROcodone-acetaminophen (NORCO) 7.5-325 mg per tablet Take 1 tablet by mouth 2 times daily as needed       No current facility-administered medications for this visit.     No Known Allergies (drug, envir, food or latex)    Patient's social history, family history, medications, surgical history, and problem list  were reviewed with the patient today and updated/populated in eRecord and reflected in the subsequent notations.  There are no problems to display for this patient.    No past medical history on file.  No past surgical history on file.  No family history on file.  Social History     Socioeconomic History    Marital status: Married     Spouse name: Not on file    Number of children: Not on file    Years of education: Not on file    Highest education level: Not on file   Tobacco Use    Smoking status: Current Every Day Smoker     Packs/day: 0.50     Years: 30.00     Pack years: 15.00    Smokeless tobacco: Never Used   Substance and Sexual Activity    Alcohol use: Yes    Drug use: Yes     Types: Marijuana    Sexual activity: Yes     Partners: Female     Birth control/protection: None   Other Topics Concern    Not on file   Social History Narrative    Not on file       Vitals:    04/24/21 1314   BP: 126/86   Pulse: 90   Temp: 36.7 C (98 F)   Weight: 63 kg (138 lb 12.8 oz)   Height: 1.702 m (5\' 7" )     Body mass index is 21.74 kg/m.    GENERAL: Alert, in no apparent distress,  Normal Habitus.  Appears stated age.    PSYCH:  good attention, well groomed, oriented, normal affect  CHEST: Clear to auscultation bilaterally with good air movement and no wheezes or rales.  No accessory muscle usage.  Breathing comfortably with unlabored respirations.  Chest wall movement symmetric.  CARDIAC: Regular without gallops or rubs;  no heaves, no thrills;  no murmurs.  VASCULAR:   No edema.  NEURAL EXAM: Normal speech.  Gait coordinated and smooth.  Observed dexterity without tremor.    Assessment/Plan:  1) urinary incontinence;  Currently stable.  Continue with oxybutynin 5 mg twice daily.    2) multiple sclerosis:  Currently stable.  Patient does not want to have a referral for neurology at this time.  He will continue with current management with gabapentin and lamotrigine.    3) CLL:  -Currently stable.  Patient does  not want to get involved with oncology  at this time.  He is agreeable to do blood work to reassess for need of oncology involvement.    4) history of spinal fusion:  -Unchanged.  Patient would like to continue with his current medication hydrocodone acetaminophen 7.5-325 mg as needed.  He is aware that I will not be prescribing that medication, if that medication is needed, he will need to go to pain management clinic.  -He is also aware that he can discuss this further with Dr. Rolena Infante.    5) tobacco use:  -Currently smoking half a pack a day, and not ready to quit at this time.  Aware that he should notify when ready.    **Follow-up in 2 months, sooner if needed.  Patient aware there is fasting blood work to be done prior to the appointment.

## 2021-04-24 NOTE — Patient Instructions (Addendum)
Curren, it was a pleasure talking to you today.    - We are going to wait for your medical records for further measure and prescriptions.    Next official follow-up is in 2 months    Please do your fasting blood work one week before your next appointment.    In order to provide you better service, please plan to arrive 15 minutes earlier than your scheduled appointment time.    If there is any further needs, please do not hesitate to contact us.    Stay safe and healthy.

## 2021-06-22 ENCOUNTER — Other Ambulatory Visit
Admission: RE | Admit: 2021-06-22 | Discharge: 2021-06-22 | Disposition: A | Discharge status: Home / Self Care | Payer: Medicare (Managed Care) | Source: Ambulatory Visit | Attending: Adult Health | Admitting: Adult Health

## 2021-06-22 ENCOUNTER — Other Ambulatory Visit: Payer: Self-pay | Admitting: Adult Health

## 2021-06-22 DIAGNOSIS — D72820 Lymphocytosis (symptomatic): Secondary | ICD-10-CM

## 2021-06-22 DIAGNOSIS — C9110 Chronic lymphocytic leukemia of B-cell type not having achieved remission: Secondary | ICD-10-CM | POA: Insufficient documentation

## 2021-06-22 DIAGNOSIS — Z981 Arthrodesis status: Secondary | ICD-10-CM | POA: Insufficient documentation

## 2021-06-22 DIAGNOSIS — Z720 Tobacco use: Secondary | ICD-10-CM | POA: Insufficient documentation

## 2021-06-22 DIAGNOSIS — Z139 Encounter for screening, unspecified: Secondary | ICD-10-CM | POA: Insufficient documentation

## 2021-06-22 DIAGNOSIS — R32 Unspecified urinary incontinence: Secondary | ICD-10-CM | POA: Insufficient documentation

## 2021-06-22 DIAGNOSIS — G35 Multiple sclerosis: Secondary | ICD-10-CM | POA: Insufficient documentation

## 2021-06-22 DIAGNOSIS — Z7689 Persons encountering health services in other specified circumstances: Secondary | ICD-10-CM | POA: Insufficient documentation

## 2021-06-22 LAB — CBC AND DIFFERENTIAL
Baso # K/uL: 0 10*3/uL (ref 0.0–0.1)
Basophil %: 0 %
Eos # K/uL: 0 10*3/uL (ref 0.0–0.5)
Eosinophil %: 0 %
Hematocrit: 44 % (ref 40–51)
Hemoglobin: 14.2 g/dL (ref 13.7–17.5)
Lymph # K/uL: 54.7 10*3/uL — ABNORMAL HIGH (ref 1.3–3.6)
Lymphocyte %: 90.5 %
MCH: 31 pg (ref 26–32)
MCHC: 32 g/dL (ref 32–37)
MCV: 95 fL — ABNORMAL HIGH (ref 79–92)
Mono # K/uL: 0.9 10*3/uL — ABNORMAL HIGH (ref 0.3–0.8)
Monocyte %: 1.5 %
Neut # K/uL: 4.8 10*3/uL (ref 1.8–5.4)
Nucl RBC # K/uL: 0 10*3/uL (ref 0.0–0.0)
Nucl RBC %: 0 /100 WBC (ref 0.0–0.2)
Platelets: 251 10*3/uL (ref 150–330)
RBC: 4.6 MIL/uL (ref 4.6–6.1)
RDW: 14.1 % (ref 11.6–14.4)
Seg Neut %: 8 %
WBC: 60.1 10*3/uL — ABNORMAL HIGH (ref 4.2–9.1)

## 2021-06-22 LAB — COMPREHENSIVE METABOLIC PANEL
ALT: 17 U/L (ref 0–50)
AST: 19 U/L (ref 0–50)
Albumin: 4.6 g/dL (ref 3.5–5.2)
Alk Phos: 120 U/L (ref 40–130)
Anion Gap: 9 (ref 7–16)
Bilirubin,Total: 0.4 mg/dL (ref 0.0–1.2)
CO2: 29 mmol/L — ABNORMAL HIGH (ref 20–28)
Calcium: 9.7 mg/dL (ref 8.6–10.2)
Chloride: 104 mmol/L (ref 96–108)
Creatinine: 1.06 mg/dL (ref 0.67–1.17)
Glucose: 103 mg/dL — ABNORMAL HIGH (ref 60–99)
Lab: 14 mg/dL (ref 6–20)
Potassium: 4.9 mmol/L (ref 3.3–5.1)
Sodium: 142 mmol/L (ref 133–145)
Total Protein: 6.5 g/dL (ref 6.3–7.7)
eGFR BY CREAT: 78 *

## 2021-06-22 LAB — DIFF MANUAL: Diff Based On: 200 CELLS

## 2021-06-22 LAB — LIPID PANEL
Chol/HDL Ratio: 3
Cholesterol: 182 mg/dL
HDL: 61 mg/dL — ABNORMAL HIGH (ref 40–60)
LDL Calculated: 106 mg/dL
Non HDL Cholesterol: 121 mg/dL
Triglycerides: 75 mg/dL

## 2021-06-23 ENCOUNTER — Other Ambulatory Visit
Admission: RE | Admit: 2021-06-23 | Discharge: 2021-06-23 | Disposition: A | Discharge status: Home / Self Care | Payer: Medicare (Managed Care) | Source: Ambulatory Visit | Attending: Adult Health | Admitting: Adult Health

## 2021-06-23 ENCOUNTER — Encounter: Payer: Self-pay | Admitting: Adult Health

## 2021-06-23 DIAGNOSIS — D72820 Lymphocytosis (symptomatic): Secondary | ICD-10-CM

## 2021-06-23 LAB — CBC AND DIFFERENTIAL
Baso # K/uL: 0 10*3/uL (ref 0.0–0.1)
Basophil %: 0 %
Eos # K/uL: 0.3 10*3/uL (ref 0.0–0.5)
Eosinophil %: 0.5 %
Hematocrit: 43 % (ref 40–51)
Hemoglobin: 14 g/dL (ref 13.7–17.5)
Lymph # K/uL: 49.5 10*3/uL — ABNORMAL HIGH (ref 1.3–3.6)
Lymphocyte %: 80.5 %
MCH: 31 pg (ref 26–32)
MCHC: 33 g/dL (ref 32–37)
MCV: 94 fL — ABNORMAL HIGH (ref 79–92)
Mono # K/uL: 3.4 10*3/uL — ABNORMAL HIGH (ref 0.3–0.8)
Monocyte %: 5.5 %
Neut # K/uL: 8.6 10*3/uL — ABNORMAL HIGH (ref 1.8–5.4)
Nucl RBC # K/uL: 0 10*3/uL (ref 0.0–0.0)
Nucl RBC %: 0 /100 WBC (ref 0.0–0.2)
Platelets: 248 10*3/uL (ref 150–330)
RBC: 4.5 MIL/uL — ABNORMAL LOW (ref 4.6–6.1)
RDW: 14 % (ref 11.6–14.4)
Seg Neut %: 13.5 %
WBC: 61.1 10*3/uL — ABNORMAL HIGH (ref 4.2–9.1)

## 2021-06-23 LAB — DIFF MANUAL: Diff Based On: 200 CELLS

## 2021-06-24 ENCOUNTER — Ambulatory Visit: Payer: Medicare (Managed Care) | Admitting: Adult Health

## 2021-06-24 VITALS — BP 142/76 | HR 91 | Temp 97.0°F | Wt 138.7 lb

## 2021-06-24 DIAGNOSIS — G35 Multiple sclerosis: Secondary | ICD-10-CM

## 2021-06-24 DIAGNOSIS — Z720 Tobacco use: Secondary | ICD-10-CM

## 2021-06-24 DIAGNOSIS — M5431 Sciatica, right side: Secondary | ICD-10-CM

## 2021-06-24 DIAGNOSIS — C9110 Chronic lymphocytic leukemia of B-cell type not having achieved remission: Secondary | ICD-10-CM

## 2021-06-24 DIAGNOSIS — R32 Unspecified urinary incontinence: Secondary | ICD-10-CM

## 2021-06-24 MED ORDER — TIZANIDINE HCL 4 MG PO CAPS *A*
4.0000 mg | ORAL_CAPSULE | Freq: Three times a day (TID) | ORAL | 0 refills | Status: DC
Start: 2021-06-24 — End: 2021-10-02

## 2021-06-24 NOTE — Progress Notes (Signed)
CC:  Patient presents today to establish care and discuss acute concerns as outlined below  HPI:    1) urinary incontinence:  Currently taking oxybutynin 5 mg 3 times daily.provided with neuro dt M S.  -Patient reports doing well with the medication, reports no concerns at this time.    3) M S:  -Patient reports symptoms started with neuropathy.  -Currently he takes gabapentin 800 mg twice daily, and Lamotrigine 100 mg twice daily.  -Patient reports he used to see Dr. Laurie Panda neurology at [facility] in [place].   -We discussed doing a referral to neurology for follow-up, he reports that he has been told that "this is a very mild case" so he just wants to continue monitoring for now.  He does not want a referral to neuro.  -Today he reports there is no change in his symptoms.    4) CLL:  -Patient reports that he did not bring the paperwork from Dr. Harlow Asa possibly showing the latest lab work values.  Patient is aware that the last white blood cell count was at 60 and 61 on repeat.  He reports that in the past it was "through the roof" he is not sure of the number.  -We are going to repeat request for records and patient will also call to request.  -At this time he is still declines referral to oncology.    5) tobacco use:  Patient reports smoking half a pack a day.  Not ready to stop smoking.    6) Back pain:  -Patient reports that he had back fusion.  This happened last July 2021.  Patient reports his surgeon's name is Dr. Cyndy Freeze at [facility].  -Patient reports that he was taking hydrocodone acetaminophen (Norco) 7.5-325 tablet however, he was not taking as prescribed for twice daily, patient reports that he would take half a tablet "only as needed".  -Last refilled per FYI in [place] 09/30/2020.  At last appointment in May 2022, patient reported that he still had 3 Tablets, he would not like to run out of them.  He is aware that I would not be prescribing any pain  medication at this time until we receive the information from his back surgeon.  -Patient is aware that there is a pain medication service or the spine specialist clinic if he is in pain.  -Today patient reports that he has pain in his lower right back that radiates down his leg.  We did discuss the likelihood of sciatic pain.  He reports that in the past he had that and he was treated with muscle relaxants.      Medications reviewed and confirmed with the patient.  Current Outpatient Medications   Medication Sig Dispense Refill    Multiple Vitamin (MULTIVITAMIN PO) Take by mouth daily      TURMERIC CURCUMIN PO Take 500 mg by mouth daily      Multiple Vitamins-Minerals (LUTEIN-ZEAXANTHIN PO) Take 20 mg by mouth daily      cholecalciferol, Vitamin D3, (VITAMIN D) 50 mcg (2,000 unit) tablet Take 6,000 units by mouth daily      B Complex Vitamins (VITAMIN-B COMPLEX PO) Take by mouth daily      Zinc 50 MG TABS Take by mouth daily      Fish Oil 1000 MG CAPS capsule Take 1 g by mouth daily      VITAMIN E PO Take 180 mg by mouth daily      Lactobacillus (  ACIDOPHILUS PROBIOTIC PO) Take by mouth daily      Alpha-Lipoic Acid 200 MG CAPS Take 200 mg by mouth daily      Specialty Vitamins Products (COLLAGEN ULTRA) CAPS Take 2.5 g by mouth daily      Misc Natural Products (COMPLETE PROSTATE HEALTH) TABS Take by mouth daily      gabapentin (NEURONTIN) 800mg  tablet Take 1 tablet (800 mg total) by mouth 2 times daily (Patient taking differently: Take 800 mg by mouth 3 times daily  ) 180 tablet 0    lamoTRIgine (LAMICTAL) 100 mg tablet Take 1 tablet (100 mg total) by mouth 2 times daily 180 tablet 0    oxybutynin (DITROPAN) 5 mg tablet Take 1 tablet (5 mg total) by mouth 2 times daily 180 tablet 1    HYDROcodone-acetaminophen (NORCO) 7.5-325 mg per tablet Take 1 tablet by mouth 2 times daily as needed       No current facility-administered medications for this visit.     No Known Allergies (drug, envir, food or  latex)    Patient's social history, family history, medications, surgical history, and problem list were reviewed with the patient today and updated/populated in eRecord and reflected in the subsequent notations.  Patient Active Problem List    Diagnosis Date Noted    Urinary incontinence 06/22/2021    Multiple sclerosis 06/22/2021    CLL (chronic lymphocytic leukemia) 06/22/2021    History of spinal fusion 06/22/2021    Tobacco use 06/22/2021     No past medical history on file.  No past surgical history on file.  No family history on file.  Social History     Socioeconomic History    Marital status: Married     Spouse name: Not on file    Number of children: Not on file    Years of education: Not on file    Highest education level: Not on file   Tobacco Use    Smoking status: Current Every Day Smoker     Packs/day: 0.50     Years: 30.00     Pack years: 15.00    Smokeless tobacco: Never Used   Substance and Sexual Activity    Alcohol use: Yes    Drug use: Yes     Types: Marijuana    Sexual activity: Yes     Partners: Female     Birth control/protection: None   Other Topics Concern    Not on file   Social History Narrative    Not on file       There were no vitals filed for this visit.  There is no height or weight on file to calculate BMI.    GENERAL: Alert, in no apparent distress,  Normal Habitus.  Appears stated age.    PSYCH:  good attention, well groomed, oriented, normal affect  CHEST: Clear to auscultation bilaterally with good air movement and no wheezes or rales.  No accessory muscle usage.  Breathing comfortably with unlabored respirations.  Chest wall movement symmetric.  CARDIAC: Regular without gallops or rubs;  no heaves, no thrills;  no murmurs.  VASCULAR:   No edema  Musculoskeletal: Back: Range of motion with slight limitation on extension and sidebending to the right.  Straight leg test positive for the right.  NEURAL EXAM: Normal speech.  Gait coordinated and smooth.  Observed  dexterity without tremor.    Assessment/Plan:  1) urinary incontinence;  Currently stable.  Continue with oxybutynin 5 mg twice daily.  2) multiple sclerosis:  Currently stable.  Patient does not want to have a referral for neurology at this time.  He will continue with current management with gabapentin and lamotrigine.    3) CLL:  -Currently stable.  Patient does not want to get involved with oncology at this time.  He is agreeable to do blood work to reassess for need of oncology involvement.    4) sciatica of right side:  -Pain has been bothering him for the last couple of weeks.  -Discussed use of tizanidine 4 mg 3 times daily as needed.  Patient aware it can be sedative.  Patient also encouraged to use gabapentin 800 mg 1 extra time a day for 10 days.  -Patient aware that if symptoms do not improve slowly, next step will be physical therapy.    5) tobacco use:  -Currently smoking half a pack a day, and not ready to quit at this time.  Aware that he should notify when ready.    **Follow-up in 3  months, sooner if needed.

## 2021-06-24 NOTE — Patient Instructions (Addendum)
-   please request your oncologist info from Dr. Harlow Asa. We already sent the release of information. If you have information that shows lab values that would help me see if there has been a big change, please take a picture and send them to me via MyChart.    - please make sure you take tizanadine 4 mg up to three times a day, and we are going to have you add one 800 mg capsule of gabapentin for the next 10 days to help with sciatic pain. Use advil 400 mg up to three times a day for pain as needed.  - If there is no relief, please let me know and we will refer to physical therapy    -Whenever you are ready to stop smoking, please let me know and we can discuss your options.    -Next appointment Dr. Rolena Infante in October.

## 2021-07-13 IMAGING — RF DG C-ARM 1-60 MIN
1 series · 2 of 2 positions shown · non-contrast
Comparison: MRI lumbar spine dated 01/16/2020

CLINICAL DATA: L3-L4 posterior fusion.

EXAM:
DG C-ARM 1-60 MIN
FLUOROSCOPY TIME:  Fluoroscopy Time:  38 seconds
Radiation Exposure Index (if provided by the fluoroscopic device):
15.92 mGy
Number of Acquired Spot Images: 5

[Series 1: run · 2 of 2 slices shown]
[im 1/2]
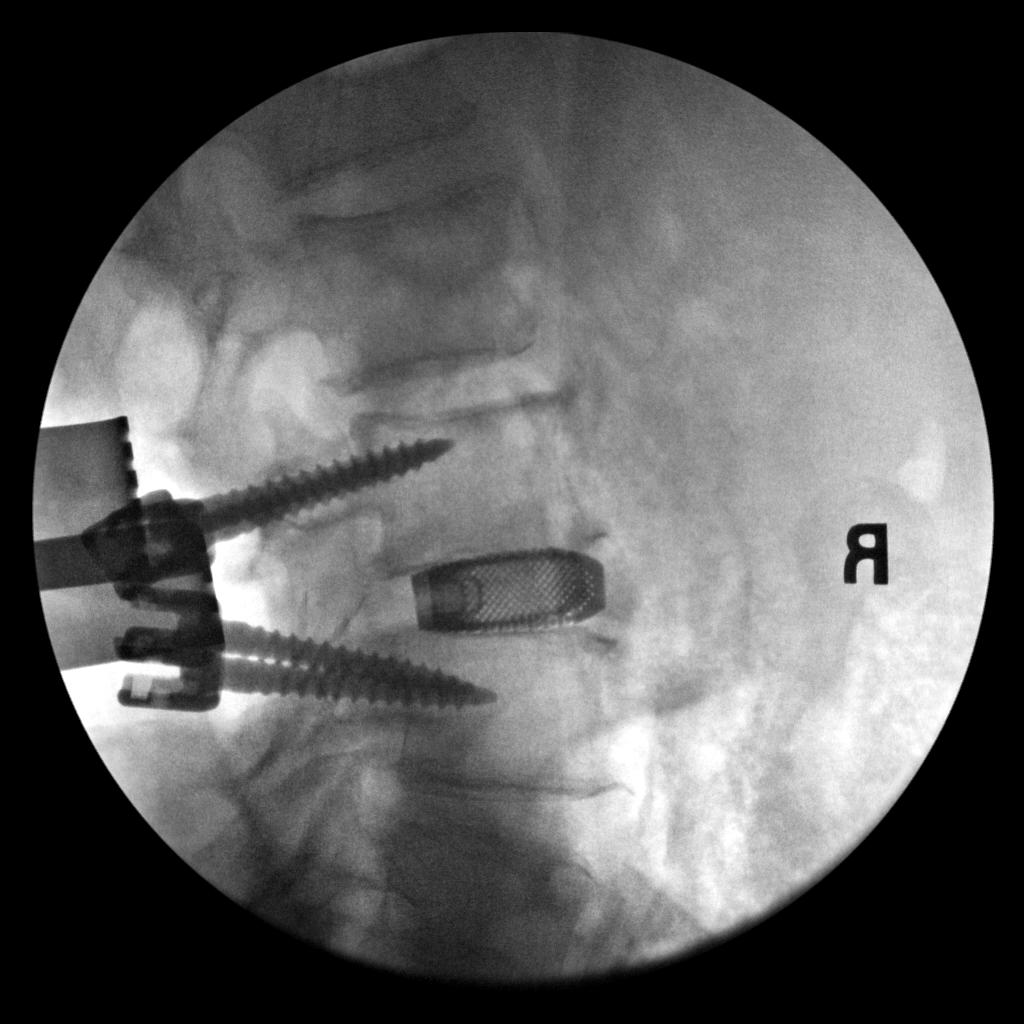
[im 2/2]
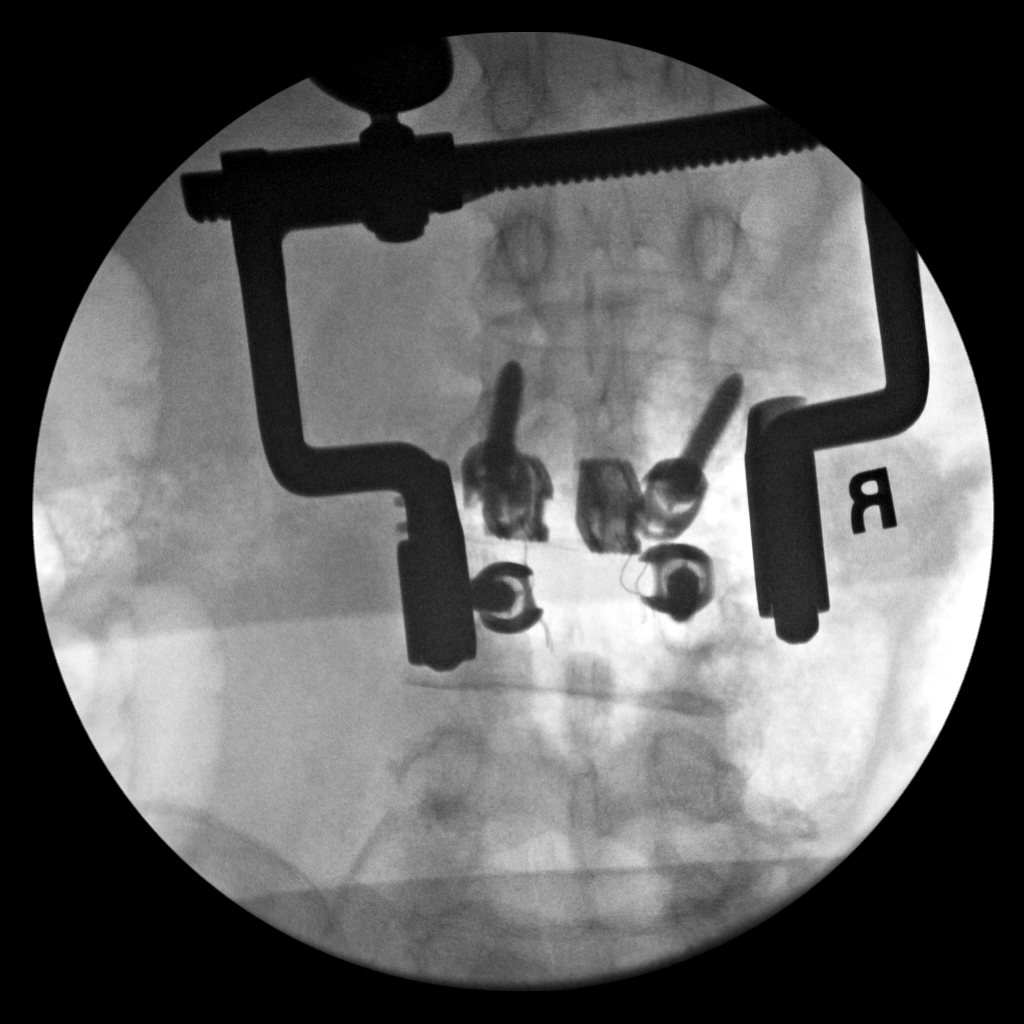

[2 of 2 positions shown; findings below may reference images not displayed]

FINDINGS: The patient has undergone reported posterior fusion from L3-L4 with
an interbody spacer at the L3-L4 level. The hardware appears intact.
There are expected postsurgical changes. The alignment appears
unremarkable.
IMPRESSION: Status post posterior fusion as above.

## 2021-07-13 IMAGING — RF DG LUMBAR SPINE 2-3V
1 series · 2 of 2 positions shown · non-contrast
Comparison: 06/04/2020

CLINICAL DATA: L3-4 posterior fusion

EXAM:
LUMBAR SPINE - 2-3 VIEW

[Series 1: run · 2 of 2 slices shown]
[im 1/2]
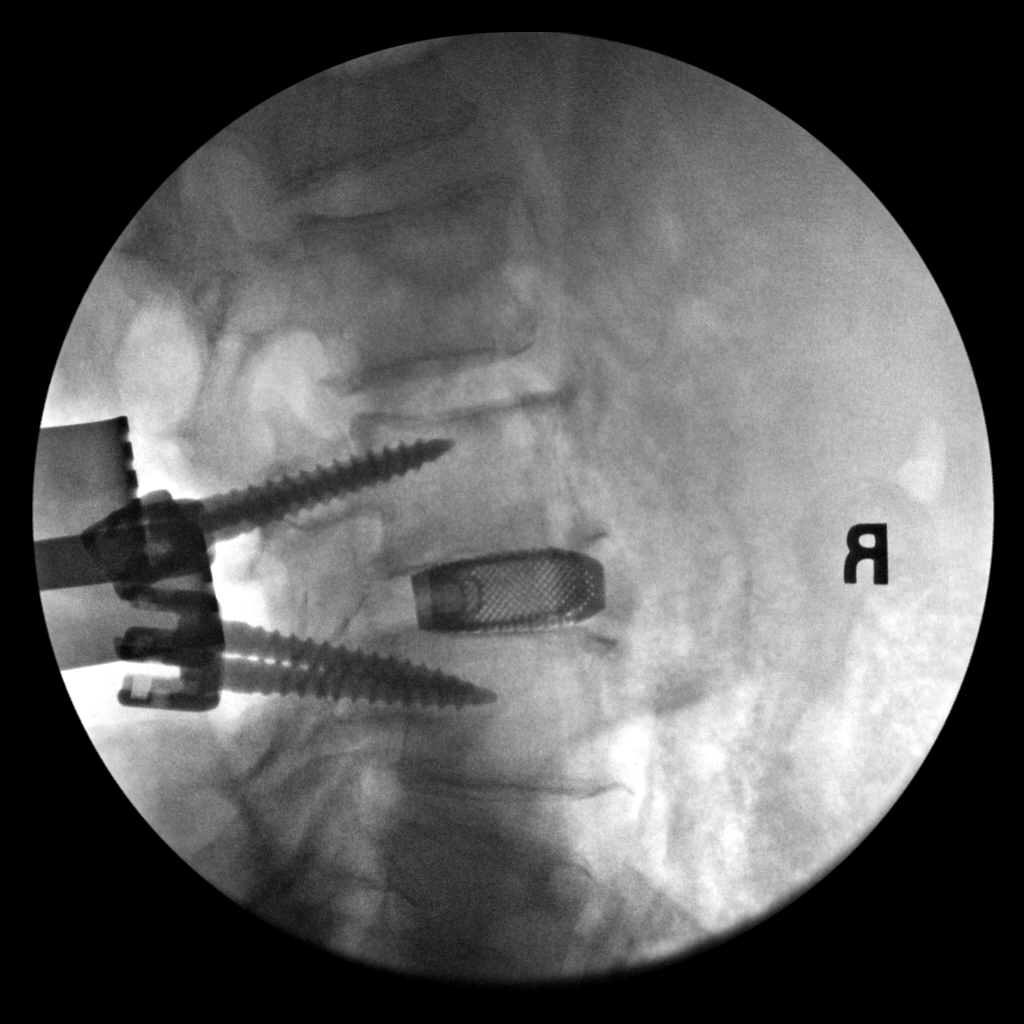
[im 2/2]
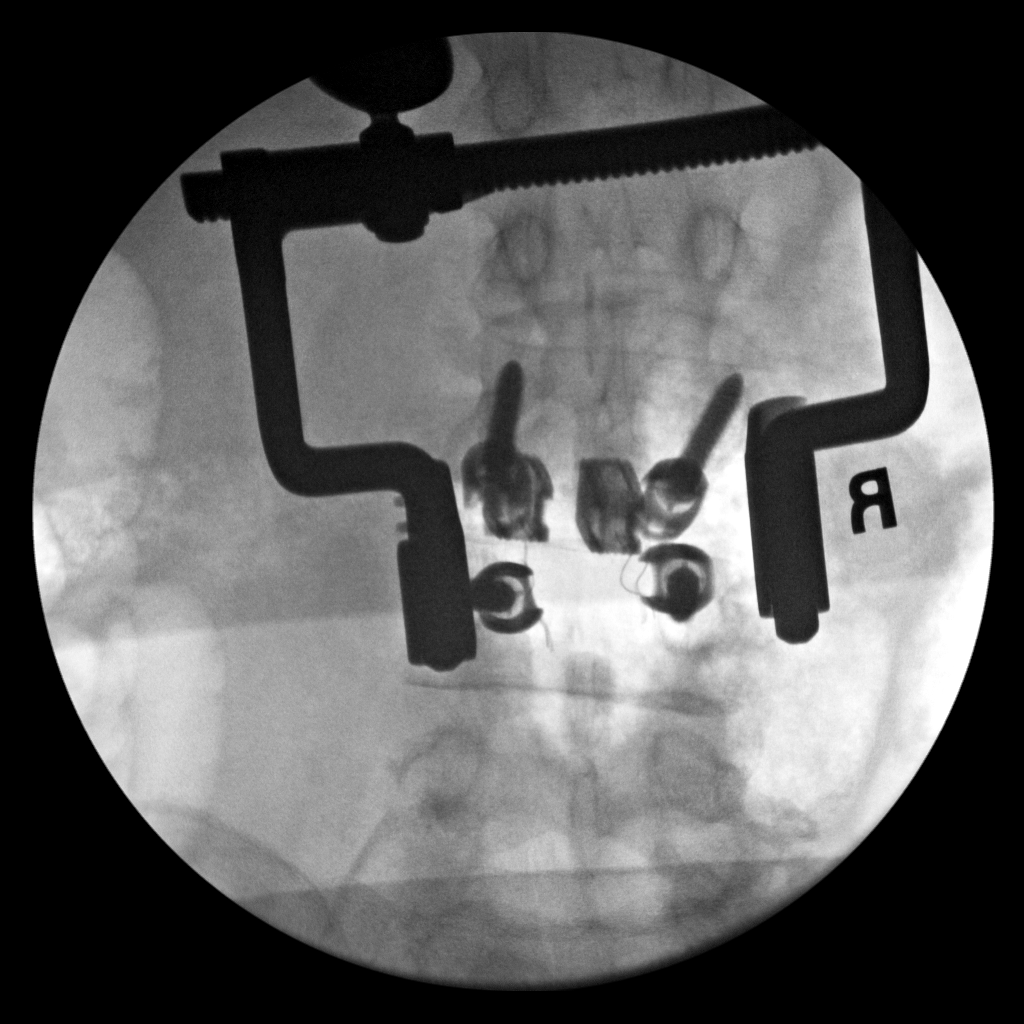

[2 of 2 positions shown; findings below may reference images not displayed]

FINDINGS: Two intraoperative spot images demonstrate placement of posterior
pedicle screws at L3-4. No hardware complicating feature.
IMPRESSION: Posterior fusion at L3-4.  No visible complicating feature.

## 2021-07-13 IMAGING — CR DG LUMBAR SPINE 2-3V
2 series · 2 of 2 positions shown · non-contrast
Comparison: 04/04/2020 CT.

CLINICAL DATA: L3-L4 micro discectomy

EXAM:
LUMBAR SPINE - 2-3 VIEW

[lateral (1 of 2)]
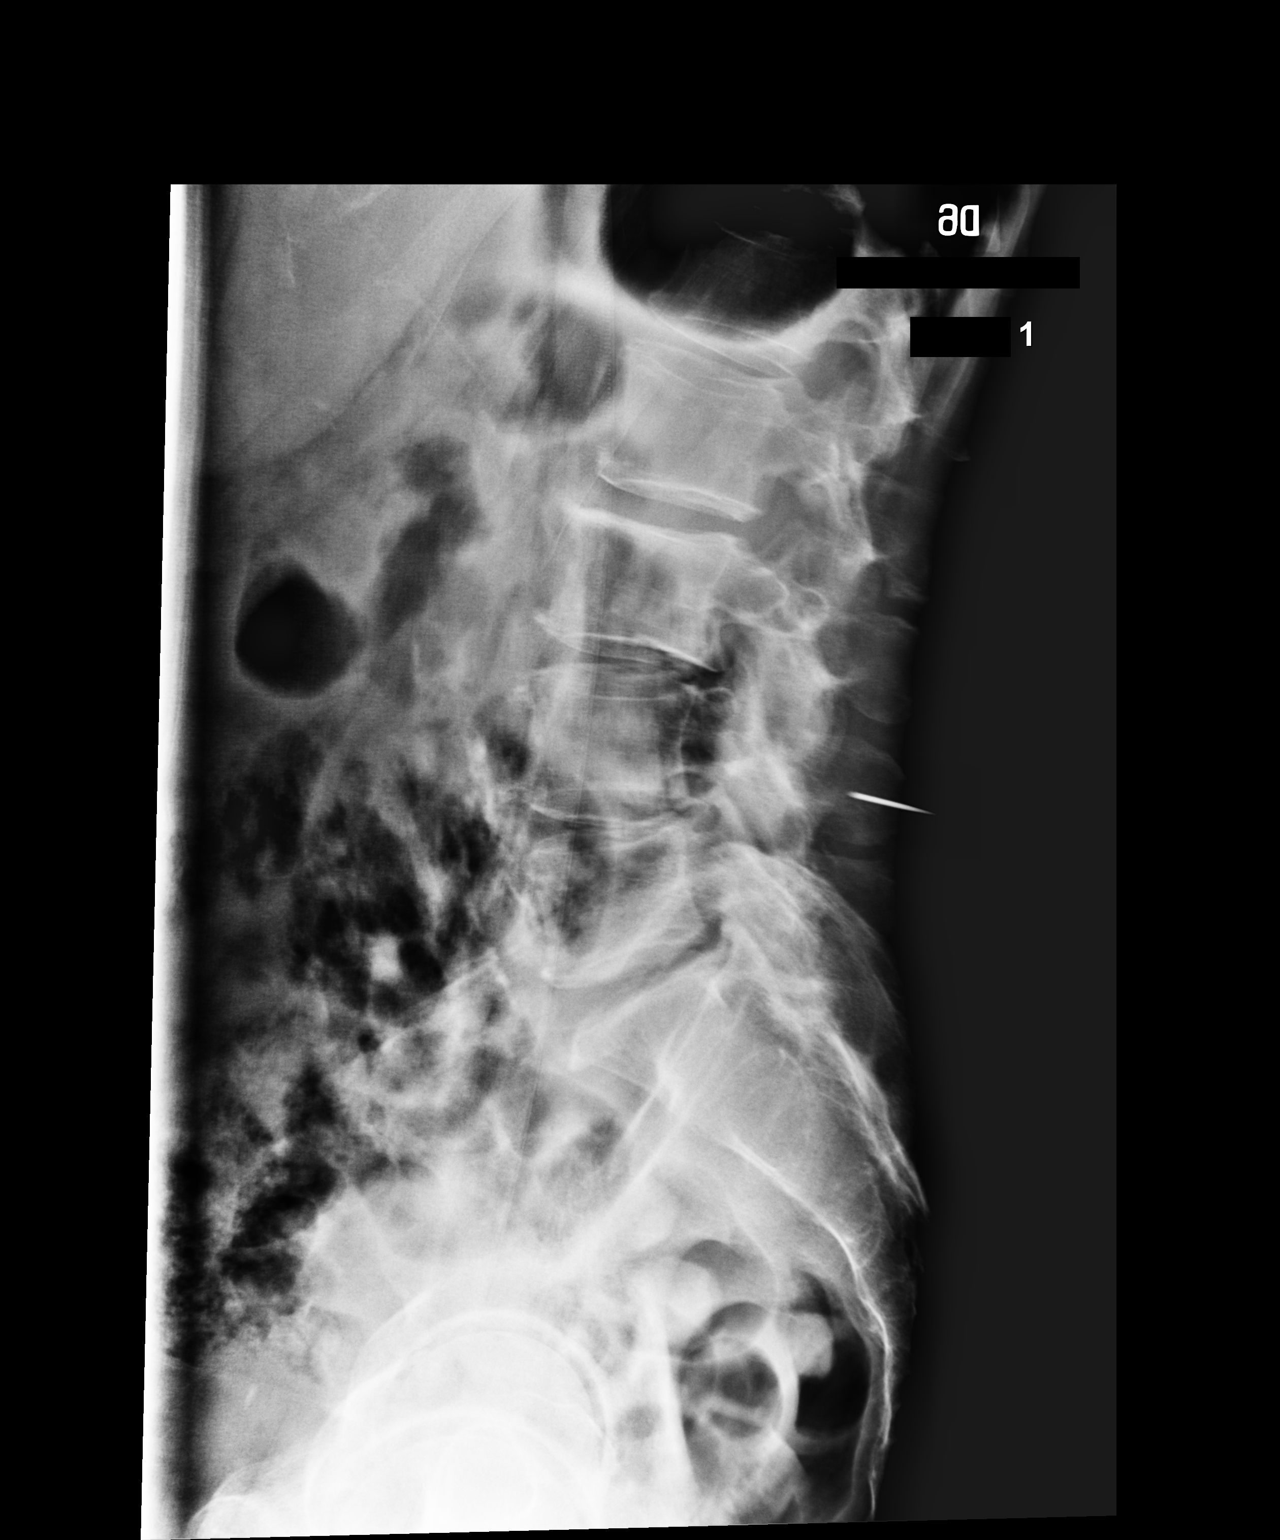

[lateral (2 of 2)]
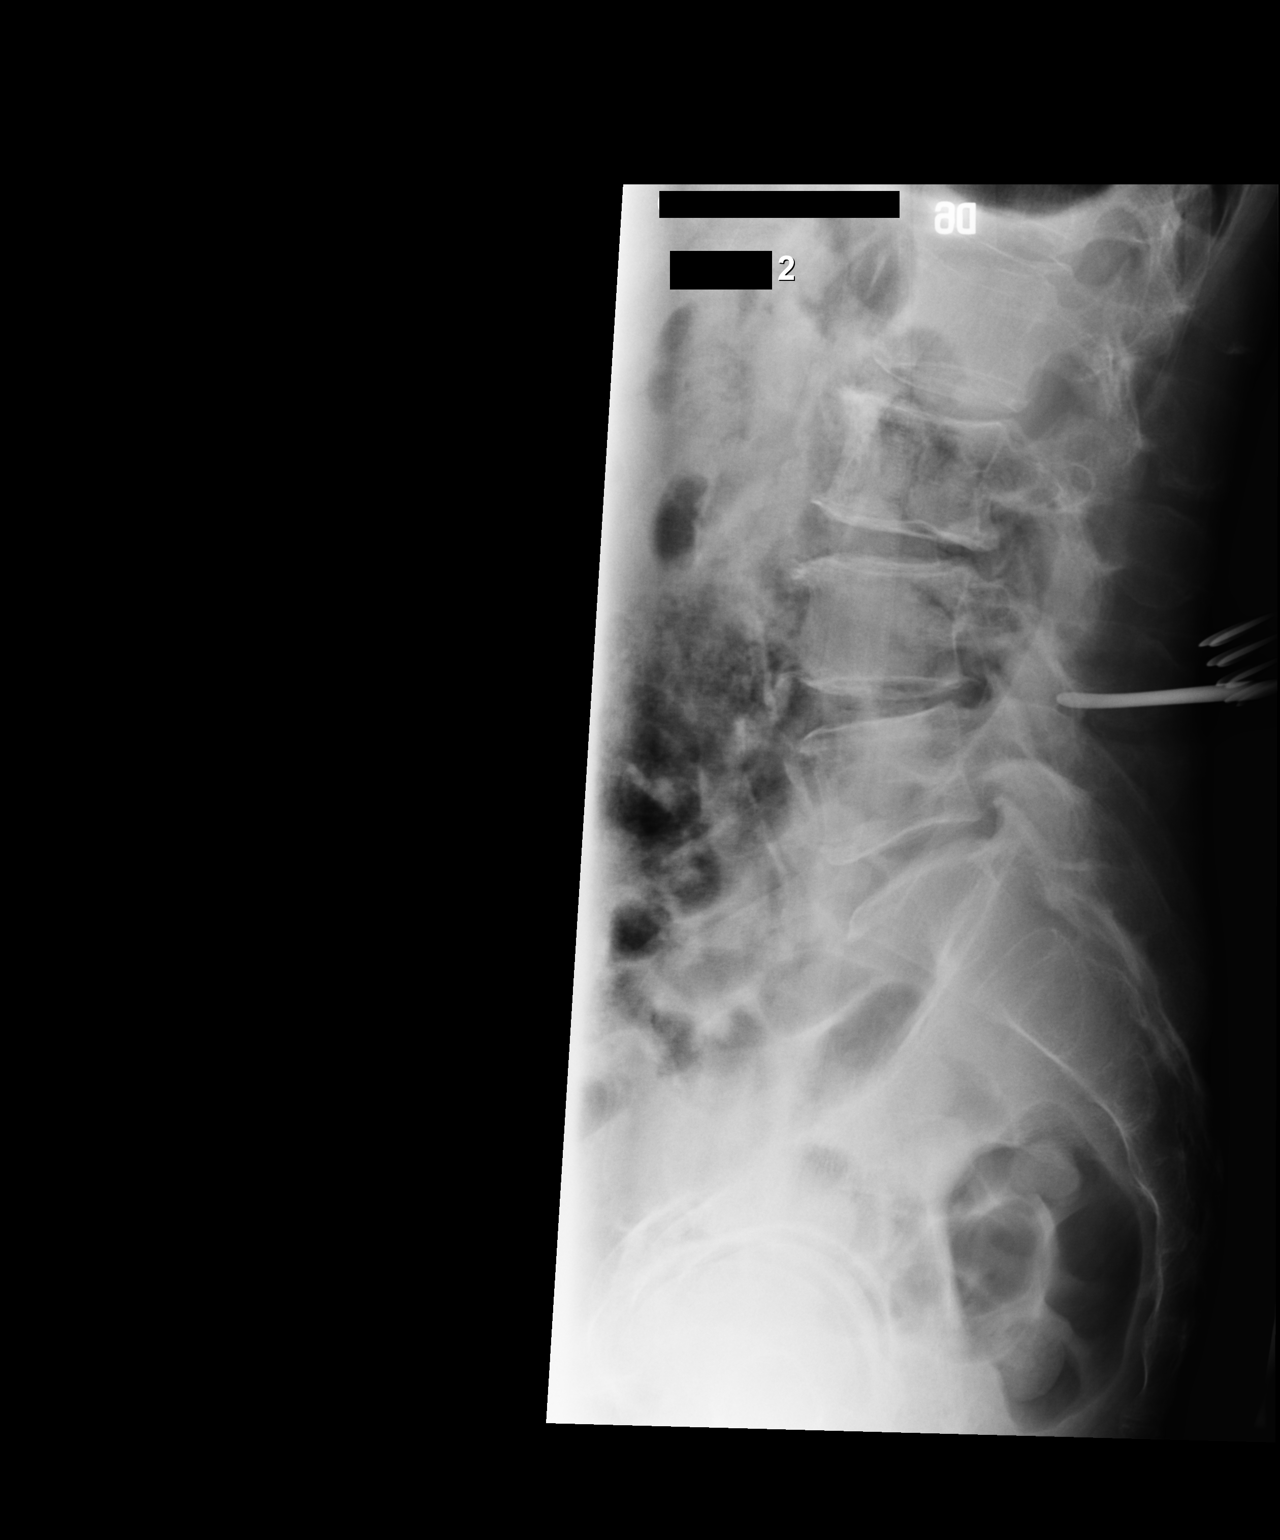

[2 of 2 positions shown; findings below may reference images not displayed]

FINDINGS: Lateral intraoperative images demonstrate instrumentation at the
presumed L4-L5 level. Degenerative changes are noted of the lumbar
spine.
IMPRESSION: Intraoperative images as above.

## 2021-07-18 ENCOUNTER — Other Ambulatory Visit: Payer: Self-pay | Admitting: Adult Health

## 2021-07-20 NOTE — Telephone Encounter (Signed)
Last Appointment: 06/24/2021  Next Appointment:   Future Appointments   Date Time Provider [place]   09/28/2021  2:20 PM Montalbo, Osborne Casco, MD [facility] None

## 2021-07-23 ENCOUNTER — Other Ambulatory Visit: Payer: Self-pay | Admitting: Adult Health

## 2021-07-23 NOTE — Telephone Encounter (Signed)
Last Appointment: 06/24/2021  Next Appointment:   Future Appointments   Date Time Provider [place]   09/28/2021  2:20 PM Montalbo, Osborne Casco, MD [facility] None

## 2021-09-28 ENCOUNTER — Other Ambulatory Visit: Payer: Self-pay

## 2021-09-28 ENCOUNTER — Ambulatory Visit: Payer: Medicare (Managed Care) | Admitting: Internal Medicine

## 2021-09-28 VITALS — BP 142/88 | HR 87 | Temp 97.8°F | Ht 66.0 in | Wt 142.6 lb

## 2021-09-28 DIAGNOSIS — C9110 Chronic lymphocytic leukemia of B-cell type not having achieved remission: Secondary | ICD-10-CM

## 2021-09-28 DIAGNOSIS — Z981 Arthrodesis status: Secondary | ICD-10-CM

## 2021-09-28 DIAGNOSIS — Z720 Tobacco use: Secondary | ICD-10-CM

## 2021-09-28 DIAGNOSIS — G35 Multiple sclerosis: Secondary | ICD-10-CM

## 2021-09-28 DIAGNOSIS — H9319 Tinnitus, unspecified ear: Secondary | ICD-10-CM

## 2021-09-28 MED ORDER — VARENICLINE TARTRATE 1 MG PO TABS *I*
1.0000 mg | ORAL_TABLET | Freq: Two times a day (BID) | ORAL | 1 refills | Status: DC
Start: 2021-09-28 — End: 2021-10-20

## 2021-09-28 NOTE — Patient Instructions (Addendum)
Good to meet you  Discussed medical issues  Consider referral to specialist multiple sclerosis history, leukemia, chronic back pains    Discussed tinnitus  Consider hearing test     Healthy lifestyle  Smoking cessation discussed  Chantix prescribed  Work on habits/triggers.    Will check labs every 6 months    Thank you for spending time with me.  I hope the visit was to your complete satisfaction.  Please communicate any concerns and follow up often to address issues.    Continue to assure your records are up to date.  Make sure to complete your health maintenance requirements, especially to prevent illness and cancers.      Health Maintenance Topics with due status: Overdue       Topic Date Due    COVID-19 Vaccine Never done    HIV Screening USPSTF/[place] Never done    Hepatitis C Screening USPSTF/[place] Never done    IMM-ZOSTER Never done    Colon Cancer Screening USPSTF Never done    IMM-PREVNAR VACCINE 108 + YRS Never done    IMM-PNEUMOVAX VACCINE 63 + YRS Never done    AAA Screening USPSTF Never done    IMM-INFLUENZA Never done     Health Maintenance Topics with due status: Not Due       Topic Last Completion Date    DEPRESSION SCREEN YEARLY 09/28/2021       Please arrive 15 minutes before your appointment time so my staff can efficiently room you.  I can then quickly see you and be respectful of your time.      Again it was my pleasure to take care of you.  Dr. Rolena Infante

## 2021-10-02 ENCOUNTER — Other Ambulatory Visit: Payer: Self-pay | Admitting: Adult Health

## 2021-10-02 NOTE — Telephone Encounter (Signed)
Last Appointment: 09/28/2021  Next Appointment:   Future Appointments   Date Time Provider [place]   11/03/2021 11:40 AM Montalbo, Osborne Casco, MD [facility] None

## 2021-10-11 ENCOUNTER — Encounter: Payer: Self-pay | Admitting: Internal Medicine

## 2021-10-11 NOTE — Progress Notes (Signed)
Chief Complaint   Patient presents with    New Patient Visit       ASSESSMENT/PLAN    Jesus Morse was seen today for new patient visit.    Diagnoses and all orders for this visit:    Multiple sclerosis    Tobacco use    CLL (chronic lymphocytic leukemia)    History of spinal fusion    Tinnitus    Other orders  -     varenicline (CHANTIX) 1 mg tablet; Take 1 tablet (1 mg total) by mouth 2 times daily          Time spent managing medical issues with plan documented under AVS instructions.    As listed above, chronic medical illnesses discussed, management addressed    Labs reviewed.      Lab results: 06/23/21  1417   WBC 61.1*   Hemoglobin 14.0   Hematocrit 43   RBC 4.5*   Platelets 248         Lab results: 06/22/21  0840   Sodium 142   Potassium 4.9   Chloride 104   CO2 29*   UN 14   Creatinine 1.06   Glucose 103*   Calcium 9.7   Total Protein 6.5   Albumin 4.6   ALT 17   AST 19   Alk Phos 120   Bilirubin,Total 0.4         Lab results: 06/22/21  0840   Cholesterol 182   HDL 61*   LDL Calculated 106   Triglycerides 75   Chol/HDL Ratio 3.0     No components found with this basename: NHLDC        Time spent, reviewing/updating epic records and updating health maintenance.    Health Maintenance Topics with due status: Overdue       Topic Date Due    COVID-19 Vaccine Never done    HIV Screening USPSTF/[place] Never done    Hepatitis C Screening USPSTF/[place] Never done    IMM-ZOSTER Never done    Colon Cancer Screening USPSTF Never done    IMM-PREVNAR VACCINE 30 + YRS Never done    IMM-PNEUMOVAX VACCINE 60 + YRS Never done    AAA Screening USPSTF Never done    IMM-INFLUENZA Never done     Health Maintenance Topics with due status: Not Due       Topic Last Completion Date    DEPRESSION SCREEN YEARLY 09/28/2021       With any prescribed meds, indications, side effects, interactions discussed.  Patient to also confer with pharmacist.    Instructions printed and/or provided through [facility], see copy below:    Good to meet you  Discussed  medical issues  Consider referral to specialist multiple sclerosis history, leukemia, chronic back pains    Discussed tinnitus  Consider hearing test     Healthy lifestyle  Smoking cessation discussed  Chantix prescribed  Work on habits/triggers.    Will check labs every 6 months    Coded by problems addressed, amount/complexity, risk.  ___________    HPI    Establish care with me.  Has seen [name]    Multiple sclerosis, leukemia history    Chronic back pains  H/o spinal fusion  No recent injury or exertions    C/o ringing in ears  No ear pains or drainage    Wishes to be healthier  Positive smoking    ROS  Denies fevers, chills  No shortness of breath  No URI symptoms.  No chest pains or palpitations  No swelling  No abdominal pains or bowel symptoms.  No urine symptoms.  No numbness or weakness.    Patient Active Problem List   Diagnosis Code    Urinary incontinence R32    Multiple sclerosis G35    CLL (chronic lymphocytic leukemia) C91.10    History of spinal fusion Z98.1    Tobacco use Z72.0       No past medical history on file.    No past surgical history on file.    No family history on file.    Social History     Socioeconomic History    Marital status: Married   Tobacco Use    Smoking status: Every Day     Packs/day: 0.50     Years: 30.00     Pack years: 15.00     Types: Cigarettes    Smokeless tobacco: Never   Substance and Sexual Activity    Alcohol use: Yes    Drug use: Yes     Types: Marijuana    Sexual activity: Yes     Partners: Female     Birth control/protection: None       Current Outpatient Medications   Medication Sig    tiZANidine (ZANAFLEX) 4 mg capsule TAKE 1 CAPSULE (4 MG TOTAL) BY MOUTH 3 TIMES DAILY    varenicline (CHANTIX) 1 mg tablet Take 1 tablet (1 mg total) by mouth 2 times daily    gabapentin (GABAPENTIN) 854m tablet Take 1 tablet (800 mg total) by mouth 3 times daily    lamoTRIgine (LAMICTAL) 100 mg tablet TAKE 1 TABLET BY MOUTH 2 TIMES DAILY    Multiple Vitamin  (MULTIVITAMIN PO) Take by mouth daily    TURMERIC CURCUMIN PO Take 500 mg by mouth daily    Multiple Vitamins-Minerals (LUTEIN-ZEAXANTHIN PO) Take 20 mg by mouth daily    cholecalciferol, Vitamin D3, (VITAMIN D) 50 mcg (2,000 unit) tablet Take 6,000 units by mouth daily    B Complex Vitamins (VITAMIN-B COMPLEX PO) Take by mouth daily    Zinc 50 MG TABS Take by mouth daily    Fish Oil 1000 MG CAPS capsule Take 1 g by mouth daily    VITAMIN E PO Take 180 mg by mouth daily    Lactobacillus (ACIDOPHILUS PROBIOTIC PO) Take by mouth daily    Alpha-Lipoic Acid 200 MG CAPS Take 200 mg by mouth daily    Specialty Vitamins Products (COLLAGEN ULTRA) CAPS Take 2.5 g by mouth daily    Misc Natural Products (COMPLETE PROSTATE HEALTH) TABS Take by mouth daily    oxybutynin (DITROPAN) 5 mg tablet Take 1 tablet (5 mg total) by mouth 2 times daily       Prescribed medications reviewed and updated/modified as needed.    OBJECTIVE    BP 142/88 (BP Location: Right arm, Patient Position: Sitting, Cuff Size: adult)    Pulse 87    Temp 36.6 C (97.8 F) (Temporal)    Ht 1.676 m (_0 )    Wt 64.7 kg (142 lb 9.6 oz)    SpO2 98%    BMI 23.02 kg/m     Vitals noted  Alert, NAD  Vision and hearing grossly intact.  Membranes moist  Lungs CTA.  Breathing effortlessly.  CV RRR   Abd:  Soft, NT/ND  EXT:  No edema  NEURO:      No focal loss of sensation or strength.  Normal gait and balance.  Normal mentation.  PSYCH: Normal affect and behavior.  Not anxious.

## 2021-10-17 ENCOUNTER — Other Ambulatory Visit: Payer: Self-pay | Admitting: Adult Health

## 2021-10-19 NOTE — Telephone Encounter (Signed)
Last Appointment: 09/28/2021  Next Appointment:   Future Appointments   Date Time Provider [place]   11/03/2021 11:40 AM Montalbo, Osborne Casco, MD [facility] None

## 2021-10-20 ENCOUNTER — Other Ambulatory Visit: Payer: Self-pay | Admitting: Internal Medicine

## 2021-10-20 MED ORDER — VARENICLINE TARTRATE 1 MG PO TABS *I*
1.0000 mg | ORAL_TABLET | Freq: Two times a day (BID) | ORAL | 3 refills | Status: AC
Start: 2021-10-20 — End: ?

## 2021-10-20 NOTE — Telephone Encounter (Signed)
Last Appointment: 09/28/2021  Next Appointment:   Future Appointments   Date Time Provider [place]   11/03/2021 11:40 AM Montalbo, Osborne Casco, MD [facility] None

## 2021-11-03 ENCOUNTER — Other Ambulatory Visit: Payer: Self-pay

## 2021-11-03 ENCOUNTER — Encounter: Payer: Self-pay | Admitting: Internal Medicine

## 2021-11-03 ENCOUNTER — Ambulatory Visit: Payer: Medicare (Managed Care) | Admitting: Internal Medicine

## 2021-11-03 VITALS — BP 134/70 | HR 70 | Temp 96.9°F | Wt 147.8 lb

## 2021-11-03 DIAGNOSIS — Z136 Encounter for screening for cardiovascular disorders: Secondary | ICD-10-CM

## 2021-11-03 DIAGNOSIS — M62830 Muscle spasm of back: Secondary | ICD-10-CM

## 2021-11-03 DIAGNOSIS — F17200 Nicotine dependence, unspecified, uncomplicated: Secondary | ICD-10-CM

## 2021-11-03 DIAGNOSIS — G35 Multiple sclerosis: Secondary | ICD-10-CM

## 2021-11-03 DIAGNOSIS — C9110 Chronic lymphocytic leukemia of B-cell type not having achieved remission: Secondary | ICD-10-CM

## 2021-11-03 DIAGNOSIS — Z1211 Encounter for screening for malignant neoplasm of colon: Secondary | ICD-10-CM

## 2021-11-03 DIAGNOSIS — Z981 Arthrodesis status: Secondary | ICD-10-CM

## 2021-11-03 MED ORDER — GABAPENTIN 800 MG PO TABLET *I*
800.0000 mg | ORAL_TABLET | Freq: Three times a day (TID) | ORAL | 3 refills | Status: AC
Start: 2021-11-03 — End: ?

## 2021-11-03 MED ORDER — LAMOTRIGINE 100 MG PO TABS *I*
100.0000 mg | ORAL_TABLET | Freq: Two times a day (BID) | ORAL | 3 refills | Status: DC
Start: 2021-11-03 — End: 2022-11-12

## 2021-11-03 MED ORDER — TIZANIDINE HCL 4 MG PO CAPS *A*
4.0000 mg | ORAL_CAPSULE | Freq: Three times a day (TID) | ORAL | 3 refills | Status: AC | PRN
Start: 2021-11-03 — End: ?

## 2021-11-03 NOTE — Patient Instructions (Signed)
Please call [phone] to schedule AAA ultrasound

## 2021-11-09 ENCOUNTER — Encounter: Payer: Self-pay | Admitting: Internal Medicine

## 2021-11-09 NOTE — Progress Notes (Signed)
Chief Complaint   Patient presents with    Nicotine Dependence     1-2 a day        ASSESSMENT/PLAN    Jesus Morse was seen today for nicotine dependence.    Diagnoses and all orders for this visit:    Nicotine dependence    Screening for AAA (abdominal aortic aneurysm)  -     CV AAA Screening; Future    Screening for colon cancer  -     Cologuard (External Lab - [facility])    Back spasm  -     tiZANidine (ZANAFLEX) 4 mg capsule; Take 1 capsule (4 mg total) by mouth 3 times daily as needed for Muscle spasms    Multiple sclerosis  -     lamoTRIgine (LAMICTAL) 100 mg tablet; Take 1 tablet (100 mg total) by mouth 2 times daily  -     tiZANidine (ZANAFLEX) 4 mg capsule; Take 1 capsule (4 mg total) by mouth 3 times daily as needed for Muscle spasms  -     gabapentin 800mg  tablet; Take 1 tablet (800 mg total) by mouth 3 times daily    History of spinal fusion  -     lamoTRIgine (LAMICTAL) 100 mg tablet; Take 1 tablet (100 mg total) by mouth 2 times daily  -     tiZANidine (ZANAFLEX) 4 mg capsule; Take 1 capsule (4 mg total) by mouth 3 times daily as needed for Muscle spasms  -     gabapentin 800mg  tablet; Take 1 tablet (800 mg total) by mouth 3 times daily    CLL (chronic lymphocytic leukemia)           Time spent managing medical issues with plan documented under AVS instructions.    As listed above, chronic medical illnesses discussed, management addressed    F/u specialist  Med refills  Labs/Images/Tests reviewed    With any prescribed meds, indications, side effects, interactions discussed.      Coded by problems addressed, amount/complexity, risk.  ___________    HPI    F/u smoking cessation, down to 1 to 2 per day.  On chantix    Needs med refills.    ROS  Denies fevers, chills  No shortness of breath  No URI symptoms.  No chest pains or palpitations  No swelling  No abdominal pains or bowel symptoms.  No urine symptoms.  No numbness or weakness.    Patient Active Problem List   Diagnosis Code    Urinary  incontinence R32    Multiple sclerosis G35    CLL (chronic lymphocytic leukemia) C91.10    History of spinal fusion Z98.1    Tobacco use Z72.0       No past medical history on file.    No past surgical history on file.    No family history on file.    Social History     Socioeconomic History    Marital status: Married   Tobacco Use    Smoking status: Every Day     Packs/day: 0.50     Years: 30.00     Pack years: 15.00     Types: Cigarettes    Smokeless tobacco: Never   Substance and Sexual Activity    Alcohol use: Yes    Drug use: Yes     Types: Marijuana    Sexual activity: Yes     Partners: Female     Birth control/protection: None       Current  Outpatient Medications   Medication Sig    varenicline (CHANTIX) 1 mg tablet Take 1 tablet (1 mg total) by mouth 2 times daily    oxybutynin (DITROPAN) 5 mg tablet TAKE 1 TABLET BY MOUTH TWICE A DAY    Multiple Vitamin (MULTIVITAMIN PO) Take by mouth daily    TURMERIC CURCUMIN PO Take 500 mg by mouth daily    Multiple Vitamins-Minerals (LUTEIN-ZEAXANTHIN PO) Take 20 mg by mouth daily    cholecalciferol, Vitamin D3, (VITAMIN D) 50 mcg (2,000 unit) tablet Take 6,000 units by mouth daily    B Complex Vitamins (VITAMIN-B COMPLEX PO) Take by mouth daily    Zinc 50 MG TABS Take by mouth daily    Fish Oil 1000 MG CAPS capsule Take 1 g by mouth daily    VITAMIN E PO Take 180 mg by mouth daily    Lactobacillus (ACIDOPHILUS PROBIOTIC PO) Take by mouth daily    Alpha-Lipoic Acid 200 MG CAPS Take 200 mg by mouth daily    Specialty Vitamins Products (COLLAGEN ULTRA) CAPS Take 2.5 g by mouth daily    Misc Natural Products (COMPLETE PROSTATE HEALTH) TABS Take by mouth daily    lamoTRIgine (LAMICTAL) 100 mg tablet Take 1 tablet (100 mg total) by mouth 2 times daily    tiZANidine (ZANAFLEX) 4 mg capsule Take 1 capsule (4 mg total) by mouth 3 times daily as needed for Muscle spasms    gabapentin 800mg  tablet Take 1 tablet (800 mg total) by mouth 3 times daily        Prescribed medications reviewed and updated/modified as needed.    OBJECTIVE    BP 134/70 (BP Location: Right arm, Patient Position: Sitting, Cuff Size: adult)    Pulse 70    Temp 36.1 C (96.9 F) (Temporal)    Wt 67 kg (147 lb 12.8 oz)    SpO2 99%    BMI 23.86 kg/m     Vitals noted  Alert, NAD

## 2021-11-25 ENCOUNTER — Encounter: Payer: Self-pay | Admitting: Internal Medicine

## 2021-11-25 LAB — AMB COLOGUARD (EXTERNAL LAB - EXACT SCIENCES): Cologuard (FIT DNA): NEGATIVE

## 2021-12-07 ENCOUNTER — Encounter: Payer: Self-pay | Admitting: Internal Medicine

## 2022-02-16 ENCOUNTER — Ambulatory Visit: Payer: Medicare (Managed Care) | Admitting: Internal Medicine

## 2022-03-02 ENCOUNTER — Telehealth: Payer: Self-pay | Admitting: Internal Medicine

## 2022-03-02 NOTE — Telephone Encounter (Signed)
Incoming fax from [facility] insurance/physical capabilities evaluation form.     Patient has an appt on 4/5 at 1:20. Placed this form in Nurses bin to keep until appt.

## 2022-03-10 ENCOUNTER — Other Ambulatory Visit: Payer: Self-pay

## 2022-03-10 ENCOUNTER — Ambulatory Visit: Payer: Medicare (Managed Care) | Admitting: Internal Medicine

## 2022-03-10 ENCOUNTER — Encounter: Payer: Self-pay | Admitting: Internal Medicine

## 2022-03-10 VITALS — BP 136/90 | HR 80 | Temp 97.9°F | Ht 66.0 in | Wt 155.7 lb

## 2022-03-10 DIAGNOSIS — Z125 Encounter for screening for malignant neoplasm of prostate: Secondary | ICD-10-CM

## 2022-03-10 DIAGNOSIS — Z981 Arthrodesis status: Secondary | ICD-10-CM

## 2022-03-10 DIAGNOSIS — G35 Multiple sclerosis: Secondary | ICD-10-CM

## 2022-03-10 DIAGNOSIS — Z13220 Encounter for screening for lipoid disorders: Secondary | ICD-10-CM

## 2022-03-10 DIAGNOSIS — R413 Other amnesia: Secondary | ICD-10-CM

## 2022-03-10 DIAGNOSIS — C9110 Chronic lymphocytic leukemia of B-cell type not having achieved remission: Secondary | ICD-10-CM

## 2022-03-10 DIAGNOSIS — M5431 Sciatica, right side: Secondary | ICD-10-CM

## 2022-03-10 NOTE — Progress Notes (Signed)
Chief Complaint   Patient presents with    Multiple Sclerosis       ASSESSMENT/PLAN    Jesus Morse was seen today for multiple sclerosis.    Diagnoses and all orders for this visit:    History of spinal fusion  -     AMB REFERRAL TO PHYSICAL / OCCUPATIONAL THERAPY    Sciatica of right side  -     AMB REFERRAL TO PHYSICAL / OCCUPATIONAL THERAPY    Memory change  -     TSH; Future  -     Folate; Future  -     Antinuclear antibody screen; Future    Screening for prostate cancer  -     PSA (eff.03-2009); Future    CLL (chronic lymphocytic leukemia)  -     Comprehensive metabolic panel; Future  -     CBC and differential; Future    Multiple sclerosis  -     Comprehensive metabolic panel; Future    Screening, lipid  -     Lipid Panel (Reflex to Direct  LDL if Triglycerides more than 400); Future         Patient has not seen me for back pains.  Form asking specific restrictions.  Pt feels PT should be tried so he can get a better gauge of his abilities.  At first he thought he should retire but then he wanted to try working with restrictions.  Will complete form stating he is undergoing work up to determine extent of his abilities.    He thought neurology would be consider for occas word lapses and memory change.  He feels multiple sclerosis stable and neurology may only want to do routine MRIs.  Will discuss next time.    Skin lesions likely seb keratosis, esp on R upper chest near clavicle.  Skin lesion on scalp.  Will check rest of skin next visit.    Time was spent reviewing records, trying to assess functionality/limitations, and completing form.      Coded by problems addressed, amount/complexity, risk.    ___________    HPI    Here to complete disability form.    Chronic back pains  Fused L3, L4 by neurosurgeon  June 2021  Restricted since then, limiting lifting up to 35 lbs  Last visit Aug 2021    Needs disability papers filled out  Unable to work long hours.    2018  Cognitive test in past with neurology  ?multiple  sclerosis related  [place] with words    Has not been evaluated or had work up since establishing with [place].    Concern of skin lesions, near R clavicle, scalp    ROS  Denies fevers, chills  No shortness of breath  No URI symptoms.  No chest pains or palpitations  No swelling  No abdominal pains or bowel symptoms.  No urine symptoms.  No numbness or weakness.    Patient Active Problem List   Diagnosis Code    Urinary incontinence R32    Multiple sclerosis G35    CLL (chronic lymphocytic leukemia) C91.10    History of spinal fusion Z98.1    Tobacco use Z72.0       No past medical history on file.    No past surgical history on file.    No family history on file.    Social History     Socioeconomic History    Marital status: Married   Tobacco Use  Smoking status: Some Days     Packs/day: 0.50     Years: 30.00     Pack years: 15.00     Types: Cigarettes     Passive exposure: Past    Smokeless tobacco: Never    Tobacco comments:     Decreased to one cigarette weekly as of 03/10/2022   Substance and Sexual Activity    Alcohol use: Yes    Drug use: Yes     Types: Marijuana    Sexual activity: Yes     Partners: Female     Birth control/protection: None       Current Outpatient Medications   Medication Sig    lamoTRIgine (LAMICTAL) 100 mg tablet Take 1 tablet (100 mg total) by mouth 2 times daily    tiZANidine (ZANAFLEX) 4 mg capsule Take 1 capsule (4 mg total) by mouth 3 times daily as needed for Muscle spasms    gabapentin '800mg'$  tablet Take 1 tablet (800 mg total) by mouth 3 times daily    varenicline (CHANTIX) 1 mg tablet Take 1 tablet (1 mg total) by mouth 2 times daily    oxybutynin (DITROPAN) 5 mg tablet TAKE 1 TABLET BY MOUTH TWICE A DAY    Multiple Vitamin (MULTIVITAMIN PO) Take by mouth daily    TURMERIC CURCUMIN PO Take 500 mg by mouth daily    Multiple Vitamins-Minerals (LUTEIN-ZEAXANTHIN PO) Take 20 mg by mouth daily    cholecalciferol, Vitamin D3, (VITAMIN D) 50 mcg (2,000 unit)  tablet Take 3 tablets (6,000 units total) by mouth daily    B Complex Vitamins (VITAMIN-B COMPLEX PO) Take by mouth daily    Zinc 50 MG TABS Take by mouth daily    Fish Oil 1000 MG CAPS capsule Take 1 capsule (1 g total) by mouth daily    VITAMIN E PO Take 180 mg by mouth daily    Lactobacillus (ACIDOPHILUS PROBIOTIC PO) Take by mouth daily       Prescribed medications reviewed and updated/modified as needed.    OBJECTIVE    BP 136/90 (BP Location: Right arm, Patient Position: Sitting, Cuff Size: adult)    Pulse 80    Temp 36.6 C (97.9 F) (Temporal)    Ht 1.676 m ('5\' 6"'$ )    Wt 70.6 kg (155 lb 11.2 oz)    SpO2 97%    BMI 25.13 kg/m     Vitals noted  Alert, NAD  Mild kyphosis  Decr ROM  Normal gait and balance.  Normal mentation.    PSYCH: Normal affect and behavior.  Not anxious.  R stuck on brown scaly rash on R upper chest, mid clavicle.

## 2022-04-14 ENCOUNTER — Ambulatory Visit: Payer: Medicare (Managed Care) | Attending: Internal Medicine | Admitting: Physical Therapy

## 2022-04-14 ENCOUNTER — Other Ambulatory Visit: Payer: Self-pay

## 2022-04-14 DIAGNOSIS — M545 Low back pain, unspecified: Secondary | ICD-10-CM | POA: Insufficient documentation

## 2022-04-14 DIAGNOSIS — G8929 Other chronic pain: Secondary | ICD-10-CM | POA: Insufficient documentation

## 2022-04-14 NOTE — Progress Notes (Signed)
Sent via: eRecord EMR    Physician attestation for [facility] Plan of Care: Physician/NP/PA:  Montel Clock, MD  Per signature, I have reviewed and agree with the documented plan of care.    ___________________________________________________________    Please sign this Medicare plan of care for outpatient therapy treatment as required by Medicare. If you have any questions, please contact us at [phone]. We appreciate your prompt attention to this request.    Sincerely,   [facility]       [facility]TH[facility] SP[facility] + SPINE REHABIL[facility]  LUMBAR SPINE EVALUATION      04/14/2022  Diagnosis:   1. Chronic midline low back pain without sciatica          Onset date: 2021  Chronic (12+wks)   L3-4 fusion  Right sided lumbar radiculopathy 2021  PMHx: Multiple sclerosis      SUBJECTIVE    Jesus Morse is a 66 y.o. male who is present today for back care.  Mechanism of injury/history of symptoms: pt reports that he originally had surgery on the low back in 2021 secondary to pain in the low back and right leg.  He ended up having surgery for L3-4 fusion in 2021.  Since that surgery, the pain in the low back and leg improved significantly.  More recently, pt reports that he does still have pain in surgical region that is significant and also feels pain in that region with sitting for longer periods especially on a hard chair.  Pt has had maybe one episode of pain down the right leg since surgery, most of the pain is in the middle of the back where he had his surgery.     Nature of Symptoms  Relevant symptoms: Aching, Sharp, Pain , Decreased ROM   Symptom frequency: Intermittent  Symptom intensity (0 - 10 scale): Now 0 Best 0 Worst 6  Symptom location: Central N/A low back  Symptoms worsen with: Lifting, Sitting   Symptoms improve with: Rest, position changes   Prior history of low back pain.  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        OBJECTIVE    Observation  Patient was able to ambulate into the department with a normal gait pattern.      Posture  Cervcal lordosis: normal  Thoracic kyphosis:normal  Lumbar lordosis: decreased    Lateral Deviation: None    Palpation   Generalized Lumbar Pain    Segmental Mobility Assessment  NA    LUMBAR AROM / Movement pattern  Flexion: Reach to proximal knee with painful arc  Extension:  significant loss with painful  arc  Left Sidebend: Reach to mid thigh with normal movement  Right Sidebend: Reach to mid thigh with normal movement  Left Rotation:  minor loss with normal movement  Right Rotation:  moderate loss with painful arc          LE  hip LEFT   RIGHT   Strength    PROM AROM PROM AROM Left Right   flexion     3+/5 3+/5                                           Neuromuscular Assessment  Myotomes:  LE generalized weakness t/o bilat LEs; hx of MS which is likely a contributing factor      Special Tests  Lumbar:  NA  Neurodynamic: Slump,  Right LE  negative, Left LE   negative  Sacroilliac Joint:  NA  Hip: NA        Flexibility:   Hamstrings: Moderate tightness    Neuromuscular Control  Dynamic Stability Assessment:  Bridge:  fail  / Glutes: unable  Hamstrings: unable   Abdominal Wall:  unable pain    Mobility / Movement  Sit<->Stand:  Level of assistance, independent /, Qualitative assessment,fair /1 foam pad needed on the chair to reduce total ROM, mild low back pain reported  Balance: tandem stance, firm surface, 10sec with poor trunk control sup/cg ax1, increased low back pain (4-5/10)          Directional Preference Testing  Sitting Correction: symptoms reduced:  yes  Standing Correction: symptoms reduced:  Indifferent  Directional Preference: Neutral    Increased left sided low back pain with supine knee to chest              ASSESSMENT  Findings consistent with 66 y.o. male with midline low back pain at the site of his previous fusion surgery.  Pt presents with weakness grossly t/o bilat LEs consistent with his PMHx of Multiple Sclerosis.  Shows functional deficits with balance in tandem stance and sit to stand transfer with compensation and assistance needed for both movements.  Axial spine pain increases with functional testing and with testing for hip/core mm control.  Significant presentation and prognostic factors include high to moderate symptom rating, medium disability rating, and stable clinical status.   Based on clinical evaluation and assessment, the primary rehabilitation approach will include symptom modulation.    Personal factors affecting treatment/recovery:   Smoker  Comorbidities affecting treatment/recovery:   Multiple sclerosis (MS)   Surgeries: Lumbar fusion 2021  Clinical presentation:   stable  Patient complexity:     low level as indicated by above stability of condition, personal factors, environmental factors and comorbidities in addition to patient symptom presentation and impairments found on physical exam.    Prognosis:  Fair    Contraindications/Precautions/Limitation:  Per diagnosis    Short Term Goals:1visit(s) Minimal assistance with HEP/ education concepts   Long Term Goals:n/a    TREATMENT PLAN  Patient/family involved in developing  goals and treatment plan: Yes    Pt seen for one visit for HEP instruction.  Pt requests to perform HEP only at this time and will contact the office in the future as needed.  D/C to HEP only    Thank you for the referral of this patient to [facility] Sp[facility].    Krystal Eaton, PT  Supine Hamstring 90-90 10x  5sec   Supine LTR 10x  5sec   Sit to stand 10x                                                            Treatment Start Time 815   Treatment End Time 840   Total Minutes of treatment 40       Total Non-Treatment time (rest)    Total Service Based min of treatment 20   Total Time-Based min of treatment 20           Service-Based Procedures/ Modalities    Evaluation 20   Re-evaluation    Traction, mechanical    Electric stimulation (unattended)    Vasopneumatic device    Whirlpool    Total Service Based Treatment 20       Time-Based Procedures / Modalities    Therapeutic ex 20   Manual Therapy    Therapeutic Activities    Neuromuscular Re-ed    Physical Performance Test    Gait training, including stairs        Ultrasound    Electric stimulation (manual)    Iontophoresis    Contrast baths    Total Time-Based Treatment 20          POC DATE: 04/14/22

## 2022-06-07 ENCOUNTER — Other Ambulatory Visit
Admission: RE | Admit: 2022-06-07 | Discharge: 2022-06-07 | Disposition: A | Discharge status: Home / Self Care | Payer: Medicare (Managed Care) | Source: Ambulatory Visit | Attending: Internal Medicine | Admitting: Internal Medicine

## 2022-06-07 DIAGNOSIS — Z13220 Encounter for screening for lipoid disorders: Secondary | ICD-10-CM | POA: Insufficient documentation

## 2022-06-07 DIAGNOSIS — Z125 Encounter for screening for malignant neoplasm of prostate: Secondary | ICD-10-CM | POA: Insufficient documentation

## 2022-06-07 DIAGNOSIS — C9110 Chronic lymphocytic leukemia of B-cell type not having achieved remission: Secondary | ICD-10-CM | POA: Insufficient documentation

## 2022-06-07 DIAGNOSIS — G35 Multiple sclerosis: Secondary | ICD-10-CM | POA: Insufficient documentation

## 2022-06-07 DIAGNOSIS — R413 Other amnesia: Secondary | ICD-10-CM | POA: Insufficient documentation

## 2022-06-07 LAB — CBC AND DIFFERENTIAL
Baso # K/uL: 0 10*3/uL (ref 0.0–0.1)
Basophil %: 0 %
Eos # K/uL: 0 10*3/uL (ref 0.0–0.5)
Eosinophil %: 0 %
Hematocrit: 45 % (ref 40–51)
Hemoglobin: 14.3 g/dL (ref 13.7–17.5)
Lymph # K/uL: 72.6 10*3/uL — ABNORMAL HIGH (ref 1.3–3.6)
Lymphocyte %: 94 %
MCH: 31 pg (ref 26–32)
MCHC: 32 g/dL (ref 32–37)
MCV: 97 fL — ABNORMAL HIGH (ref 79–92)
Mono # K/uL: 0 10*3/uL — ABNORMAL LOW (ref 0.3–0.8)
Monocyte %: 0 %
Neut # K/uL: 4.6 10*3/uL (ref 1.8–5.4)
Nucl RBC # K/uL: 0 10*3/uL (ref 0.0–0.0)
Nucl RBC %: 0 /100 WBC (ref 0.0–0.2)
RBC: 4.6 MIL/uL (ref 4.6–6.1)
RDW: 14.3 % (ref 11.6–14.4)
Seg Neut %: 6 %
WBC: 77.2 10*3/uL — ABNORMAL HIGH (ref 4.2–9.1)

## 2022-06-07 LAB — LIPID PANEL
Chol/HDL Ratio: 3.6
Cholesterol: 213 mg/dL — AB
HDL: 60 mg/dL (ref 40–60)
LDL Calculated: 134 mg/dL — AB
Non HDL Cholesterol: 153 mg/dL
Triglycerides: 96 mg/dL

## 2022-06-07 LAB — COMPREHENSIVE METABOLIC PANEL
ALT: 20 U/L (ref 0–50)
AST: 28 U/L (ref 0–50)
Albumin: 4.7 g/dL (ref 3.5–5.2)
Alk Phos: 119 U/L (ref 40–130)
Anion Gap: 12 (ref 7–16)
Bilirubin,Total: 0.5 mg/dL (ref 0.0–1.2)
CO2: 25 mmol/L (ref 20–28)
Calcium: 9.3 mg/dL (ref 8.6–10.2)
Chloride: 102 mmol/L (ref 96–108)
Creatinine: 1.13 mg/dL (ref 0.67–1.17)
Glucose: 88 mg/dL (ref 60–99)
Lab: 17 mg/dL (ref 6–20)
Potassium: 4.9 mmol/L (ref 3.3–5.1)
Sodium: 139 mmol/L (ref 133–145)
Total Protein: 6.4 g/dL (ref 6.3–7.7)
eGFR BY CREAT: 72 *

## 2022-06-07 LAB — FOLATE: Folate: >14 ng/mL (ref >=4.6)

## 2022-06-07 LAB — TSH: TSH: 1.24 u[IU]/mL (ref 0.27–4.20)

## 2022-06-07 LAB — PSA (EFF.4-2010): PSA (eff. 4-2010): 2.61 ng/mL (ref 0.00–4.00)

## 2022-06-07 LAB — DIFF MANUAL: Diff Based On: 200 CELLS

## 2022-06-09 ENCOUNTER — Telehealth: Payer: Self-pay

## 2022-06-09 LAB — RESOLUTION

## 2022-06-09 LAB — ANTINUCLEAR ANTIBODY SCREEN: ANA Screen: NEGATIVE

## 2022-06-09 NOTE — Telephone Encounter (Signed)
[facility] states [facility] platelet count clumped and and unable to process. If questions, call Jesus Morse [phone] [phone]

## 2022-06-10 ENCOUNTER — Ambulatory Visit: Payer: Medicare (Managed Care) | Admitting: Internal Medicine

## 2022-06-10 ENCOUNTER — Other Ambulatory Visit: Payer: Self-pay

## 2022-06-10 ENCOUNTER — Encounter: Payer: Self-pay | Admitting: Internal Medicine

## 2022-06-10 VITALS — BP 138/80 | HR 102 | Temp 97.1°F | Ht 66.0 in | Wt 151.0 lb

## 2022-06-10 DIAGNOSIS — G35 Multiple sclerosis: Secondary | ICD-10-CM

## 2022-06-10 DIAGNOSIS — F17210 Nicotine dependence, cigarettes, uncomplicated: Secondary | ICD-10-CM

## 2022-06-10 DIAGNOSIS — Z0000 Encounter for general adult medical examination without abnormal findings: Secondary | ICD-10-CM

## 2022-06-10 DIAGNOSIS — C9110 Chronic lymphocytic leukemia of B-cell type not having achieved remission: Secondary | ICD-10-CM

## 2022-06-10 DIAGNOSIS — F17200 Nicotine dependence, unspecified, uncomplicated: Secondary | ICD-10-CM

## 2022-06-10 NOTE — Progress Notes (Unsigned)
Visit performed as:      {TIP AWV by telephone is covered by [facility] [employer] and [employer]. AWV is not covered by telephone for other payers.:28549:::1}       {AMBPCNAWVVisit ZOXW:[id number]}    Today we reviewed and updated Jesus Morse's smoking status, activities of daily living, depression screen, fall risk, medications and allergies.   I have counseled the patient in the above areas.     Subjective:     Chief Complaint: Jesus Morse is a 66 y.o. male here for a/an Welcome to Medicare visit    In general, Jesus Morse rates their overall health as:  {DESC; POOR/FAIR/GOOD/EXCELLENT:19665}    Diet:  {diet:21018009}    Exercise:  {exercise:21018010}    Patient Care Team:  Otis Brace, MD as PCP - General (Primary Care)     Current Outpatient Medications on File Prior to Visit   Medication Sig Dispense Refill   . lamoTRIgine (LAMICTAL) 100 mg tablet Take 1 tablet (100 mg total) by mouth 2 times daily 180 tablet 3   . gabapentin 800mg  tablet Take 1 tablet (800 mg total) by mouth 3 times daily 270 tablet 3   . varenicline (CHANTIX) 1 mg tablet Take 1 tablet (1 mg total) by mouth 2 times daily 180 tablet 3   . oxybutynin (DITROPAN) 5 mg tablet TAKE 1 TABLET BY MOUTH TWICE A DAY 180 tablet 3   . Multiple Vitamin (MULTIVITAMIN PO) Take by mouth daily     . TURMERIC CURCUMIN PO Take 500 mg by mouth daily     . Multiple Vitamins-Minerals (LUTEIN-ZEAXANTHIN PO) Take 20 mg by mouth daily     . cholecalciferol, Vitamin D3, (VITAMIN D) 50 mcg (2,000 unit) tablet Take 3 tablets (6,000 units total) by mouth daily     . B Complex Vitamins (VITAMIN-B COMPLEX PO) Take by mouth daily     . Zinc 50 MG TABS Take by mouth daily     . Fish Oil 1000 MG CAPS capsule Take 1 capsule (1 g total) by mouth daily     . VITAMIN E PO Take 180 mg by mouth daily     . Lactobacillus (ACIDOPHILUS PROBIOTIC PO) Take by mouth daily     . tiZANidine (ZANAFLEX) 4 mg capsule Take 1 capsule (4 mg total) by mouth 3 times  daily as needed for Muscle spasms 60 capsule 3     No current facility-administered medications on file prior to visit.     No Known Allergies (drug, envir, food or latex)  Patient Active Problem List    Diagnosis Date Noted   . Urinary incontinence 06/22/2021   . Multiple sclerosis 06/22/2021   . CLL (chronic lymphocytic leukemia) 06/22/2021   . History of spinal fusion 06/22/2021   . Tobacco use 06/22/2021     No past medical history on file.  No past surgical history on file.  No family history on file.  Social History     Socioeconomic History   . Marital status: Married   Tobacco Use   . Smoking status: Some Days     Packs/day: 0.50     Years: 30.00     Pack years: 15.00     Types: Cigarettes     Passive exposure: Past   . Smokeless tobacco: Never   . Tobacco comments:     Decreased to one cigarette weekly as of 03/10/2022   Substance and Sexual Activity   . Alcohol use: Yes  Comment: occasional   . Drug use: Yes     Types: Marijuana     Comment: nightly   . Sexual activity: Yes     Partners: Female     Birth control/protection: None       Objective:     Vital Signs: BP 138/80 (BP Location: Left arm, Patient Position: Sitting)   Pulse 102   Temp 36.2 C (97.1 F) (Temporal)   Ht 1.676 m (5\' 6" )   Wt 68.5 kg (151 lb)   SpO2 94%   BMI 24.37 kg/m    BMI: Body mass index is 24.37 kg/m.    Vision Screening Results (Welcome visit only):  Vision Screening    Right eye Left eye Both eyes   Without correction      With correction 20/40 20/40 20/40      EKG Results:  {NA:32542::"n/a"}    Depression Screening Results:  Recent Review Flowsheet Data     PHQ Scores 06/10/2022 09/28/2021 04/24/2021    PHQ Calculated Score 0 0 0        Opioid Use/DAST- 10 Screening Results:   How many times in the past year have you used an illegal drug or used a prescription medication for nonmedical reasons?: 0 (06/10/2022  2:06 PM)    Activities of Daily Living/Functional Screening Results:  Is the person deaf or does he/she have  serious difficulty hearing?: Y  *Hearing Status: HOH (Left ear Tinnitus)  Is this person blind or does he/she have serious difficulty seeing even when wearing glasses?: N  *Vision Status: Visual aid   Does this person have serious difficulty walking or climbing stairs?: N  Does this person have difficulty dressing or bathing?: N  *Shopping: Independent  *House Keeping: Independent  *Managing Own Medications: Independent  *Handling Finances: Independent  If you need help, who helps you?: Wife  Difficulty doing errands due to a physicial, mental or emotional condition: No  Difficulty remembering or making decisions due to a physicial, mental or emotional condition: No      Fall Risk Screening Results:  Have you fallen in the last year?: No  Do you feel you are at risk for falling?: No      Assessment and Plan:     Cognitive Function:  Recall of recent and remote events appears:  {NORMAL - DEFAULT:29247::"Normal"}      Advanced Care Planning:  {Advanced Care Planning :21018011}    The following health maintenance plan was reviewed with the patient:    Health Maintenance Topics with due status: Overdue       Topic Date Due    COVID-19 Vaccine Never done    IMM Pneumo: 65+ Years Never done    HIV Screening USPSTF/[place] Never done    Hepatitis C Screening USPSTF/[place] Never done    IMM-ZOSTER Never done    IMM DTaP/Tdap/Td Never done    AAA Screening USPSTF Never done     Health Maintenance Topics with due status: Not Due       Topic Last Completion Date    Colon Cancer Screening USPSTF 11/18/2021    DEPRESSION SCREEN YEARLY 06/10/2022    Fall Risk Screening 06/10/2022    IMM-INFLUENZA Not Due     This health maintenance schedule, identified risks, a list of orders placed today and patient goals have been provided to Jesus Morse in the after visit summary.     Plan for any concerns identified during screening or risk assessments:  {NA:32542::"n/a"}

## 2022-06-10 NOTE — Patient Instructions (Signed)
Thank you for completing your Welcome to Medicare visit   with Jesus Morse today.     The purpose of this visits was to:     Screen for disease   Assess risk of future medical problems   Help develop a healthy lifestyle   Update vaccines   Get to know your doctor in case of an illness    Patient Care Team:  Otis Brace, MD as PCP - General (Primary Care)     Medicare 5 Year Plan    The following items were identified as areas of concern during your screening today:  Smoking- This is a risk factor for many kids of Cancer, Heart Attack, Stroke, Kidney Problems, Eye Problems, Asthma, COPD, and can cause an overall decrease in energy.       The Health Maintenance table below identifies screening tests and immunizations recommended by your health care team:  Health Maintenance: These screening recommendations are based on USPSTF, [employer], and [place] state guidelines   Topic Date Due   . COVID-19 Vaccine (1) Never done   . Pneumococcal Vaccination (1 - PCV) Never done   . HIV Screening  Never done   . Hepatitis C Screening  Never done   . Shingles Vaccine (1 of 2) Never done   . DTaP/Tdap/Td Vaccines (1 - Tdap) Never done   . Abdominal Aortic Aneurysm Screening  Never done   . Flu Shot (1) 08/06/2022   . DEPRESSION SCREEN YEARLY  06/11/2023   . Fall Risk Screening  06/11/2023   . Colon Cancer Screening  11/18/2024     In addition, goals and orders placed to address these recommendations are listed in the "Today's Visit" section.    We wish you the best of health and look forward to seeing you again next year for your Annual Medicare Wellness Visit.     If you have any health care concerns before then, please do not hesitate to contact Jesus Morse.

## 2022-06-20 ENCOUNTER — Encounter: Payer: Self-pay | Admitting: Internal Medicine

## 2022-06-20 NOTE — Progress Notes (Signed)
Chief Complaint   Patient presents with   . Welcome to Medicare visit       ASSESSMENT/PLAN    Jesus Morse was seen today for welcome to medicare visit.    Diagnoses and all orders for this visit:    Preventative health care  -     EKG 12 lead; Standing  -     EKG 12 lead    CLL (chronic lymphocytic leukemia)    Multiple sclerosis    Nicotine dependence        Time spent managing medical issues with plan documented under AVS instructions.    High white cells  Discussed Heme/Onc consult  Pt denies assoc sxs    Time spent, reviewing/updating epic records and updating health maintenance.    Health Maintenance Topics with due status: Overdue       Topic Date Due    COVID-19 Vaccine Never done    IMM Pneumo: 65+ Years Never done    HIV Screening [facility]/[place] Never done    Hepatitis C Screening [facility]/[place] Never done    IMM-ZOSTER Never done    IMM DTaP/Tdap/Td Never done    AAA Screening [facility] Never done     Health Maintenance Topics with due status: Not Due       Topic Last Completion Date    Colon Cancer Screening [facility] 11/18/2021    DEPRESSION SCREEN YEARLY 06/10/2022    Fall Risk Screening 06/10/2022    IMM-INFLUENZA Not Due       ___________    HPI    Wellness visit    ROS  Denies fevers, chills, weight change, fatigue, sweats.  Denies rash, itching, or skin lesions.  Denies vision or hearing changes, no eye or ear pains, no drainage.  Denies nasal drainage or congestion, no sinus pains.  No sore throat.  Denies chest pains, palpitations, orthopnea, leg swelling.  Denies cough, shortness of breath, wheezing.  Denies heartburn, nausea, vomiting, abdominal pains, diarrhea, constipation, blood in stool, black stool.  Denies urinary symptoms (burning, urgency, frequency, bleeding)  Denies muscle aches, neck pain, back pain, joint pains.  No joint swelling or redness.  Denies easy bruising or bleeding.  Denies dizziness, headaches, tremors, focal numbness or weakness.  No problems with memory or concentration.  Denies depression,  anxiety, insomnia.    Patient Active Problem List   Diagnosis Code   . Urinary incontinence R32   . Multiple sclerosis G35   . CLL (chronic lymphocytic leukemia) C91.10   . History of spinal fusion Z98.1   . Tobacco use Z72.0       History reviewed. No pertinent past medical history.    History reviewed. No pertinent surgical history.    History reviewed. No pertinent family history.    Social History     Socioeconomic History   . Marital status: Married   Tobacco Use   . Smoking status: Some Days     Packs/day: 0.50     Years: 30.00     Pack years: 15.00     Types: Cigarettes     Passive exposure: Past   . Smokeless tobacco: Never   . Tobacco comments:     Decreased to one cigarette weekly as of 03/10/2022   Substance and Sexual Activity   . Alcohol use: Yes     Comment: occasional   . Drug use: Yes     Types: Marijuana     Comment: nightly   . Sexual activity: Yes  Partners: Female     Birth control/protection: None       Current Outpatient Medications   Medication Sig   . lamoTRIgine (LAMICTAL) 100 mg tablet Take 1 tablet (100 mg total) by mouth 2 times daily   . gabapentin 800mg  tablet Take 1 tablet (800 mg total) by mouth 3 times daily   . varenicline (CHANTIX) 1 mg tablet Take 1 tablet (1 mg total) by mouth 2 times daily   . oxybutynin (DITROPAN) 5 mg tablet TAKE 1 TABLET BY MOUTH TWICE A DAY   . Multiple Vitamin (MULTIVITAMIN PO) Take by mouth daily   . TURMERIC CURCUMIN PO Take 500 mg by mouth daily   . Multiple Vitamins-Minerals (LUTEIN-ZEAXANTHIN PO) Take 20 mg by mouth daily   . cholecalciferol, Vitamin D3, (VITAMIN D) 50 mcg (2,000 unit) tablet Take 3 tablets (6,000 units total) by mouth daily   . B Complex Vitamins (VITAMIN-B COMPLEX PO) Take by mouth daily   . Zinc 50 MG TABS Take by mouth daily   . Fish Oil 1000 MG CAPS capsule Take 1 capsule (1 g total) by mouth daily   . VITAMIN E PO Take 180 mg by mouth daily   . Lactobacillus (ACIDOPHILUS PROBIOTIC PO) Take by mouth daily   . tiZANidine  (ZANAFLEX) 4 mg capsule Take 1 capsule (4 mg total) by mouth 3 times daily as needed for Muscle spasms       Prescribed medications reviewed and updated/modified as needed.    OBJECTIVE    BP 138/80 (BP Location: Left arm, Patient Position: Sitting)   Pulse 102   Temp 36.2 C (97.1 F) (Temporal)   Ht 1.676 m (5\' 6" )   Wt 68.5 kg (151 lb)   SpO2 94%   BMI 24.37 kg/m     Vitals noted  Alert, NAD  EOMI, sclera white.  PERRLA  Vision and hearing grossly intact.  TMs visible, no significant cerumen  No facial abnormalities  Speech clear.  Membranes moist  Neck supple  No adenopathy (cervical, supraclavicular) or thyromegaly.   No JVD, no carotid bruits.  Lungs CTA, no dullness.  Breathing effortlessly.  CV RRR   No murmur  No gallops  Abd:  NABS, soft, NT/ND, No HSM, No masses or obvious ascites.  Spine exam normal  EXT:    No edema  No calf tenderness, normal perfusion.    NEURO:  Oriented.  No focal loss of sensation or strength.  CN's grossly intact  No tremors.  No pronator drift  Normal gait and balance.  Normal mentation, memory and concentration.    PSYCH: Normal affect and behavior.  Not anxious.  SKIN:  No abnormal moles, lesions, or wounds.

## 2022-11-11 ENCOUNTER — Other Ambulatory Visit: Payer: Self-pay | Admitting: Internal Medicine

## 2022-11-11 DIAGNOSIS — Z981 Arthrodesis status: Secondary | ICD-10-CM

## 2022-11-11 DIAGNOSIS — G35 Multiple sclerosis: Secondary | ICD-10-CM

## 2022-11-11 NOTE — Telephone Encounter (Signed)
Last Appointment: 06/10/2022  Next Appointment:   Future Appointments   Date Time Provider Department Center   06/14/2023  1:40 PM [place], Armando Reichert, MD [facility] None

## 2023-06-14 ENCOUNTER — Encounter: Payer: Medicare (Managed Care) | Admitting: Internal Medicine

## 2024-01-20 ENCOUNTER — Other Ambulatory Visit: Payer: Self-pay | Admitting: Internal Medicine

## 2024-01-20 DIAGNOSIS — G35 Multiple sclerosis: Secondary | ICD-10-CM

## 2024-01-20 DIAGNOSIS — Z981 Arthrodesis status: Secondary | ICD-10-CM

## 2024-09-14 ENCOUNTER — Encounter: Payer: Self-pay | Admitting: Internal Medicine
